# Patient Record
Sex: Male | Born: 1950 | Hispanic: No | Marital: Married | Smoking: Current some day smoker
Health system: Southern US, Community
[De-identification: ages and names within clinical notes are randomized; demographics above are authoritative.]

## PROBLEM LIST (undated history)

## (undated) DIAGNOSIS — I10 Essential (primary) hypertension: Secondary | ICD-10-CM

## (undated) DIAGNOSIS — R519 Headache, unspecified: Secondary | ICD-10-CM

## (undated) DIAGNOSIS — R51 Headache: Secondary | ICD-10-CM

## (undated) HISTORY — PX: OTHER SURGICAL HISTORY: SHX169

## (undated) HISTORY — PX: CHOLECYSTECTOMY: SHX55

---

## 2014-09-03 DIAGNOSIS — Z713 Dietary counseling and surveillance: Secondary | ICD-10-CM | POA: Diagnosis not present

## 2014-09-03 DIAGNOSIS — N189 Chronic kidney disease, unspecified: Secondary | ICD-10-CM | POA: Diagnosis not present

## 2014-09-03 DIAGNOSIS — Z7189 Other specified counseling: Secondary | ICD-10-CM | POA: Diagnosis not present

## 2014-09-03 DIAGNOSIS — S40029A Contusion of unspecified upper arm, initial encounter: Secondary | ICD-10-CM | POA: Diagnosis not present

## 2014-09-03 DIAGNOSIS — Z1211 Encounter for screening for malignant neoplasm of colon: Secondary | ICD-10-CM | POA: Diagnosis not present

## 2014-09-27 DIAGNOSIS — M545 Low back pain: Secondary | ICD-10-CM | POA: Diagnosis not present

## 2014-10-11 DIAGNOSIS — M545 Low back pain: Secondary | ICD-10-CM | POA: Diagnosis not present

## 2015-02-11 DIAGNOSIS — M545 Low back pain: Secondary | ICD-10-CM | POA: Diagnosis not present

## 2015-02-11 DIAGNOSIS — N528 Other male erectile dysfunction: Secondary | ICD-10-CM | POA: Diagnosis not present

## 2015-12-23 DIAGNOSIS — N5089 Other specified disorders of the male genital organs: Secondary | ICD-10-CM | POA: Diagnosis not present

## 2015-12-23 DIAGNOSIS — K219 Gastro-esophageal reflux disease without esophagitis: Secondary | ICD-10-CM | POA: Diagnosis not present

## 2015-12-23 DIAGNOSIS — J329 Chronic sinusitis, unspecified: Secondary | ICD-10-CM | POA: Diagnosis not present

## 2015-12-23 DIAGNOSIS — M545 Low back pain: Secondary | ICD-10-CM | POA: Diagnosis not present

## 2015-12-29 DIAGNOSIS — E119 Type 2 diabetes mellitus without complications: Secondary | ICD-10-CM | POA: Diagnosis not present

## 2015-12-29 DIAGNOSIS — F17200 Nicotine dependence, unspecified, uncomplicated: Secondary | ICD-10-CM | POA: Diagnosis not present

## 2015-12-29 DIAGNOSIS — R110 Nausea: Secondary | ICD-10-CM | POA: Diagnosis not present

## 2015-12-29 DIAGNOSIS — K859 Acute pancreatitis without necrosis or infection, unspecified: Secondary | ICD-10-CM | POA: Diagnosis not present

## 2015-12-29 DIAGNOSIS — R5381 Other malaise: Secondary | ICD-10-CM | POA: Diagnosis not present

## 2015-12-29 DIAGNOSIS — K567 Ileus, unspecified: Secondary | ICD-10-CM | POA: Diagnosis not present

## 2015-12-29 DIAGNOSIS — K812 Acute cholecystitis with chronic cholecystitis: Secondary | ICD-10-CM | POA: Diagnosis not present

## 2015-12-29 DIAGNOSIS — K810 Acute cholecystitis: Secondary | ICD-10-CM | POA: Diagnosis not present

## 2015-12-29 DIAGNOSIS — Y838 Other surgical procedures as the cause of abnormal reaction of the patient, or of later complication, without mention of misadventure at the time of the procedure: Secondary | ICD-10-CM | POA: Diagnosis not present

## 2015-12-29 DIAGNOSIS — R319 Hematuria, unspecified: Secondary | ICD-10-CM | POA: Diagnosis not present

## 2015-12-29 DIAGNOSIS — N179 Acute kidney failure, unspecified: Secondary | ICD-10-CM | POA: Diagnosis not present

## 2015-12-29 DIAGNOSIS — K8080 Other cholelithiasis without obstruction: Secondary | ICD-10-CM | POA: Diagnosis not present

## 2015-12-29 DIAGNOSIS — I10 Essential (primary) hypertension: Secondary | ICD-10-CM | POA: Diagnosis not present

## 2015-12-29 DIAGNOSIS — R1013 Epigastric pain: Secondary | ICD-10-CM | POA: Diagnosis not present

## 2015-12-29 DIAGNOSIS — Z87891 Personal history of nicotine dependence: Secondary | ICD-10-CM | POA: Diagnosis not present

## 2015-12-29 DIAGNOSIS — Z7401 Bed confinement status: Secondary | ICD-10-CM | POA: Diagnosis not present

## 2015-12-29 DIAGNOSIS — K828 Other specified diseases of gallbladder: Secondary | ICD-10-CM | POA: Diagnosis not present

## 2015-12-29 DIAGNOSIS — K219 Gastro-esophageal reflux disease without esophagitis: Secondary | ICD-10-CM | POA: Diagnosis present

## 2015-12-29 DIAGNOSIS — R279 Unspecified lack of coordination: Secondary | ICD-10-CM | POA: Diagnosis not present

## 2015-12-29 DIAGNOSIS — R109 Unspecified abdominal pain: Secondary | ICD-10-CM | POA: Diagnosis not present

## 2015-12-29 DIAGNOSIS — N183 Chronic kidney disease, stage 3 (moderate): Secondary | ICD-10-CM | POA: Diagnosis present

## 2015-12-29 DIAGNOSIS — K8000 Calculus of gallbladder with acute cholecystitis without obstruction: Secondary | ICD-10-CM | POA: Diagnosis not present

## 2015-12-29 DIAGNOSIS — I129 Hypertensive chronic kidney disease with stage 1 through stage 4 chronic kidney disease, or unspecified chronic kidney disease: Secondary | ICD-10-CM | POA: Diagnosis present

## 2015-12-29 DIAGNOSIS — K769 Liver disease, unspecified: Secondary | ICD-10-CM | POA: Diagnosis not present

## 2015-12-29 DIAGNOSIS — N390 Urinary tract infection, site not specified: Secondary | ICD-10-CM | POA: Diagnosis not present

## 2015-12-29 DIAGNOSIS — K838 Other specified diseases of biliary tract: Secondary | ICD-10-CM | POA: Diagnosis not present

## 2015-12-29 DIAGNOSIS — K839 Disease of biliary tract, unspecified: Secondary | ICD-10-CM | POA: Diagnosis not present

## 2015-12-29 DIAGNOSIS — Z79899 Other long term (current) drug therapy: Secondary | ICD-10-CM | POA: Diagnosis not present

## 2015-12-29 DIAGNOSIS — G894 Chronic pain syndrome: Secondary | ICD-10-CM | POA: Diagnosis not present

## 2015-12-29 DIAGNOSIS — Z9049 Acquired absence of other specified parts of digestive tract: Secondary | ICD-10-CM | POA: Diagnosis not present

## 2015-12-29 DIAGNOSIS — K9189 Other postprocedural complications and disorders of digestive system: Secondary | ICD-10-CM | POA: Diagnosis not present

## 2015-12-29 DIAGNOSIS — K8020 Calculus of gallbladder without cholecystitis without obstruction: Secondary | ICD-10-CM | POA: Diagnosis not present

## 2015-12-30 DIAGNOSIS — K8020 Calculus of gallbladder without cholecystitis without obstruction: Secondary | ICD-10-CM | POA: Diagnosis not present

## 2016-01-01 HISTORY — PX: CHOLECYSTECTOMY, LAPAROSCOPIC: SHX56

## 2016-01-03 ENCOUNTER — Ambulatory Visit (HOSPITAL_COMMUNITY): Payer: Medicare Other

## 2016-01-03 ENCOUNTER — Ambulatory Visit (HOSPITAL_COMMUNITY)
Admission: RE | Admit: 2016-01-03 | Discharge: 2016-01-03 | Disposition: A | Discharge status: Home / Self Care | Payer: Medicare Other | Source: Ambulatory Visit | Attending: Internal Medicine | Admitting: Internal Medicine

## 2016-01-03 ENCOUNTER — Encounter (HOSPITAL_COMMUNITY)
Admission: RE | Disposition: A | Discharge status: Home / Self Care | Payer: Self-pay | Source: Ambulatory Visit | Attending: Internal Medicine

## 2016-01-03 ENCOUNTER — Encounter (HOSPITAL_COMMUNITY): Payer: Self-pay | Admitting: *Deleted

## 2016-01-03 ENCOUNTER — Ambulatory Visit (HOSPITAL_COMMUNITY): Payer: Medicare Other | Admitting: Anesthesiology

## 2016-01-03 DIAGNOSIS — E119 Type 2 diabetes mellitus without complications: Secondary | ICD-10-CM | POA: Diagnosis not present

## 2016-01-03 DIAGNOSIS — Z87891 Personal history of nicotine dependence: Secondary | ICD-10-CM | POA: Diagnosis not present

## 2016-01-03 DIAGNOSIS — K219 Gastro-esophageal reflux disease without esophagitis: Secondary | ICD-10-CM | POA: Insufficient documentation

## 2016-01-03 DIAGNOSIS — K839 Disease of biliary tract, unspecified: Secondary | ICD-10-CM | POA: Diagnosis not present

## 2016-01-03 DIAGNOSIS — Z9049 Acquired absence of other specified parts of digestive tract: Secondary | ICD-10-CM | POA: Diagnosis not present

## 2016-01-03 DIAGNOSIS — K838 Other specified diseases of biliary tract: Secondary | ICD-10-CM | POA: Diagnosis not present

## 2016-01-03 DIAGNOSIS — K9189 Other postprocedural complications and disorders of digestive system: Secondary | ICD-10-CM | POA: Diagnosis not present

## 2016-01-03 DIAGNOSIS — I10 Essential (primary) hypertension: Secondary | ICD-10-CM | POA: Diagnosis not present

## 2016-01-03 HISTORY — DX: Headache, unspecified: R51.9

## 2016-01-03 HISTORY — DX: Essential (primary) hypertension: I10

## 2016-01-03 HISTORY — PX: ERCP: SHX5425

## 2016-01-03 HISTORY — DX: Headache: R51

## 2016-01-03 SURGERY — ERCP, WITH INTERVENTION IF INDICATED
Anesthesia: General

## 2016-01-03 MED ORDER — EPHEDRINE SULFATE 50 MG/ML IJ SOLN
INTRAMUSCULAR | Status: DC | PRN
  Administered 2016-01-03: 10 mg via INTRAVENOUS

## 2016-01-03 MED ORDER — GLYCOPYRROLATE 0.2 MG/ML IJ SOLN
0.2000 mg | Freq: Once | INTRAMUSCULAR | Status: AC
  Administered 2016-01-03: 0.2 mg via INTRAVENOUS

## 2016-01-03 MED ORDER — GLYCOPYRROLATE 0.2 MG/ML IJ SOLN
INTRAMUSCULAR | Status: AC
  Filled 2016-01-03: qty 1

## 2016-01-03 MED ORDER — STERILE WATER FOR IRRIGATION IR SOLN
Status: DC | PRN
  Administered 2016-01-03: 1000 mL

## 2016-01-03 MED ORDER — CEFAZOLIN SODIUM-DEXTROSE 2-4 GM/100ML-% IV SOLN
INTRAVENOUS | Status: AC
  Filled 2016-01-03: qty 100

## 2016-01-03 MED ORDER — LABETALOL HCL 5 MG/ML IV SOLN
20.0000 mg | Freq: Once | INTRAVENOUS | Status: AC
  Administered 2016-01-03: 20 mg via INTRAVENOUS

## 2016-01-03 MED ORDER — SODIUM CHLORIDE 0.9 % IV SOLN
INTRAVENOUS | Status: AC
  Filled 2016-01-03: qty 100

## 2016-01-03 MED ORDER — GLYCOPYRROLATE 0.2 MG/ML IJ SOLN
INTRAMUSCULAR | Status: DC | PRN
  Administered 2016-01-03: 0.4 mg via INTRAVENOUS

## 2016-01-03 MED ORDER — MIDAZOLAM HCL 2 MG/2ML IJ SOLN
1.0000 mg | INTRAMUSCULAR | Status: DC | PRN
  Administered 2016-01-03: 2 mg via INTRAVENOUS

## 2016-01-03 MED ORDER — NEOSTIGMINE METHYLSULFATE 10 MG/10ML IV SOLN
INTRAVENOUS | Status: DC | PRN
  Administered 2016-01-03: 3 mg via INTRAVENOUS

## 2016-01-03 MED ORDER — LIDOCAINE HCL 1 % IJ SOLN
INTRAMUSCULAR | Status: DC | PRN
  Administered 2016-01-03: 30 mg via INTRADERMAL

## 2016-01-03 MED ORDER — MIDAZOLAM HCL 5 MG/5ML IJ SOLN
INTRAMUSCULAR | Status: DC | PRN
  Administered 2016-01-03: 2 mg via INTRAVENOUS

## 2016-01-03 MED ORDER — ONDANSETRON HCL 4 MG/2ML IJ SOLN: 4.0000 mg | Freq: Once | INTRAMUSCULAR | Status: DC | PRN

## 2016-01-03 MED ORDER — ROCURONIUM BROMIDE 100 MG/10ML IV SOLN
INTRAVENOUS | Status: DC | PRN
  Administered 2016-01-03: 5 mg via INTRAVENOUS
  Administered 2016-01-03: 15 mg via INTRAVENOUS

## 2016-01-03 MED ORDER — GLUCAGON HCL RDNA (DIAGNOSTIC) 1 MG IJ SOLR
INTRAMUSCULAR | Status: AC
  Administered 2016-01-03: 0.25 mg via INTRAVENOUS
  Filled 2016-01-03: qty 2

## 2016-01-03 MED ORDER — GLYCOPYRROLATE 0.2 MG/ML IJ SOLN
INTRAMUSCULAR | Status: AC
  Filled 2016-01-03: qty 2

## 2016-01-03 MED ORDER — LACTATED RINGERS IV SOLN
INTRAVENOUS | Status: DC
  Administered 2016-01-03 (×2): via INTRAVENOUS

## 2016-01-03 MED ORDER — NEOSTIGMINE METHYLSULFATE 10 MG/10ML IV SOLN
INTRAVENOUS | Status: AC
  Filled 2016-01-03: qty 1

## 2016-01-03 MED ORDER — MIDAZOLAM HCL 2 MG/2ML IJ SOLN
INTRAMUSCULAR | Status: AC
  Filled 2016-01-03: qty 2

## 2016-01-03 MED ORDER — PROPOFOL 10 MG/ML IV BOLUS
INTRAVENOUS | Status: AC
  Filled 2016-01-03: qty 20

## 2016-01-03 MED ORDER — ONDANSETRON HCL 4 MG/2ML IJ SOLN
4.0000 mg | Freq: Once | INTRAMUSCULAR | Status: AC
  Administered 2016-01-03: 4 mg via INTRAVENOUS

## 2016-01-03 MED ORDER — ONDANSETRON HCL 4 MG/2ML IJ SOLN
INTRAMUSCULAR | Status: AC
  Filled 2016-01-03: qty 2

## 2016-01-03 MED ORDER — SUCCINYLCHOLINE CHLORIDE 20 MG/ML IJ SOLN
INTRAMUSCULAR | Status: DC | PRN
  Administered 2016-01-03: 150 mg via INTRAVENOUS

## 2016-01-03 MED ORDER — SODIUM CHLORIDE 0.9 % IV SOLN
INTRAVENOUS | Status: DC | PRN
  Administered 2016-01-03: 100 mL

## 2016-01-03 MED ORDER — FENTANYL CITRATE (PF) 100 MCG/2ML IJ SOLN
INTRAMUSCULAR | Status: AC
  Filled 2016-01-03: qty 2

## 2016-01-03 MED ORDER — LABETALOL HCL 5 MG/ML IV SOLN
INTRAVENOUS | Status: AC
  Filled 2016-01-03: qty 4

## 2016-01-03 MED ORDER — FENTANYL CITRATE (PF) 100 MCG/2ML IJ SOLN: 25.0000 ug | INTRAMUSCULAR | Status: DC | PRN

## 2016-01-03 MED ORDER — CEFAZOLIN SODIUM-DEXTROSE 2-4 GM/100ML-% IV SOLN: 2.0000 g | Freq: Once | INTRAVENOUS | Status: DC

## 2016-01-03 MED ORDER — PROPOFOL 10 MG/ML IV BOLUS
INTRAVENOUS | Status: DC | PRN
  Administered 2016-01-03: 150 mg via INTRAVENOUS

## 2016-01-03 MED ORDER — FENTANYL CITRATE (PF) 100 MCG/2ML IJ SOLN
INTRAMUSCULAR | Status: DC | PRN
  Administered 2016-01-03: 25 ug via INTRAVENOUS

## 2016-01-03 NOTE — Op Note (Signed)
[facility] Patient Name: Nathan Gonzalez Procedure Date: 01/03/2016 9:47 AM MRN: [id number] Date of Birth: March 28, 1951 Attending MD: Lionel December , MD CSN: [id number] Age: 65 Admit Type: Outpatient Procedure:                ERCP Indications:              Bile leak Providers:                Lionel December, MD, Brain Hilts, RN, Burke Keels, Technician Referring MD:             Fraser Din, MD Medicines:                General Anesthesia Complications:            No immediate complications. Estimated Blood Loss:     Estimated blood loss: none. Procedure:                Pre-Anesthesia Assessment:                           - Prior to the procedure, a History and Physical                            was performed, and patient medications and                            allergies were reviewed. The patient's tolerance of                            previous anesthesia was also reviewed. The risks                            and benefits of the procedure and the sedation                            options and risks were discussed with the patient.                            All questions were answered, and informed consent                            was obtained. Prior Anticoagulants: The patient                            last took heparin 1 day prior to the procedure. ASA                            Grade Assessment: II - A patient with mild systemic                            disease. After reviewing the risks and benefits,  the patient was deemed in satisfactory condition to                            undergo the procedure.                           After obtaining informed consent, the scope was                            passed under direct vision. Throughout the                            procedure, the patient's blood pressure, pulse, and                            oxygen saturations were monitored continuously. The                 [id number] ([id number] scope was introduced through                            the mouth, and used to inject contrast into and                            used to inject contrast into the bile duct. The                            ERCP was accomplished without difficulty. The                            patient tolerated the procedure well. Scope In: 11:20:34 AM Scope Out: 11:39:10 AM Total Procedure Duration: 0 hours 18 minutes 36 seconds  Findings:      The major papilla was normal. The bile duct was deeply cannulated with       the Autotome sphincterotome. Contrast was injected. I personally       interpreted the bile duct images. There was brisk flow of contrast       through the ducts. Image quality was excellent. Contrast extended to the       main bile duct. Extravasation of contrast originating from the cystic       duct remnant was observed. The main bile duct was mildly dilated. The       largest diameter was 9 mm. One 10 Fr by 7 cm plastic stent with two       internal flaps was placed 6 cm into the common bile duct. Bile flowed       through the stent. The stent was in good position. Impression:               - The major papilla appeared normal.                           - The entire main bile duct was mildly dilated.                           - A bile leak was found.                           -  One plastic stent was placed into the common bile                            duct. Moderate Sedation:      Per Anesthesia Care Recommendation:           - Return patient to referring hospital for ongoing                            care to Dr. Gabriel Rung service at [facility]                           - Clear liquid diet today.                           - Resume heparin at prior dose today.                           - Will plan stent removal in 8 weeks. Procedure Code(s):        --- Professional ---                           [phone], Endoscopic retrograde                             cholangiopancreatography (ERCP); with placement of                            endoscopic stent into biliary or pancreatic duct,                            including pre- and post-dilation and guide wire                            passage, when performed, including sphincterotomy,                            when performed, each stent Diagnosis Code(s):        --- Professional ---                           K83.8, Other specified diseases of biliary tract                           K83.9, Disease of biliary tract, unspecified CPT copyright 2016 American Medical Association. All rights reserved. The codes documented in this report are preliminary and upon coder review may  be revised to meet current compliance requirements. Lionel December, MD Lionel December, MD 01/03/2016 12:03:01 PM This report has been signed electronically. Number of Addenda: 0

## 2016-01-03 NOTE — Discharge Instructions (Signed)
Resume usual medications including heparin as before. Begin clear liquids if all right with Dr. Marcha Soldersathey. Will plan stent removal in 6-8 weeks. My office will contact patient. Call if you have any questions. My pager number is [phone]

## 2016-01-03 NOTE — Anesthesia Procedure Notes (Signed)
Procedure Name: Intubation Date/Time: 01/03/2016 10:54 AM Performed by: Despina HiddenIDACAVAGE, ROBERT J Pre-anesthesia Checklist: Emergency Drugs available, Patient identified, Suction available and Patient being monitored Patient Re-evaluated:Patient Re-evaluated prior to inductionOxygen Delivery Method: Circle system utilized Preoxygenation: Pre-oxygenation with 100% oxygen Intubation Type: IV induction, Rapid sequence and Cricoid Pressure applied Ventilation: Mask ventilation without difficulty Laryngoscope Size: Mac and 3 Grade View: Grade II Tube type: Oral Tube size: 8.0 mm Number of attempts: 1 Airway Equipment and Method: Stylet Placement Confirmation: ETT inserted through vocal cords under direct vision,  positive ETCO2 and breath sounds checked- equal and bilateral Secured at: 24 cm Tube secured with: Tape Dental Injury: Teeth and Oropharynx as per pre-operative assessment  Difficulty Due To: Difficulty was anticipated and Difficult Airway- due to dentition Comments: Right upper incisor loose prior to intubation.Intact post intubation.

## 2016-01-03 NOTE — Progress Notes (Signed)
[facility] in stable condition.

## 2016-01-03 NOTE — Transfer of Care (Signed)
Immediate Anesthesia Transfer of Care Note  Patient: Nathan BlueJohn Gonzalez  Procedure(s) Performed: Procedure(s): ENDOSCOPIC RETROGRADE CHOLANGIOPANCREATOGRAPHY (ERCP) WITH STENT PLACEMENT (N/A)  Patient Location: PACU  Anesthesia Type:General  Level of Consciousness: awake and patient cooperative  Airway & Oxygen Therapy: Patient Spontanous Breathing and Patient connected to face mask oxygen  Post-op Assessment: Report given to RN, Post -op Vital signs reviewed and stable and Patient moving all extremities  Post vital signs: Reviewed and stable  Last Vitals:  Filed Vitals:   01/03/16 1000 01/03/16 1027  BP: 153/95   Pulse:    Temp:  36.8 C  Resp: 28     Last Pain:  Filed Vitals:   01/03/16 1029  PainSc: 7       Patients Stated Pain Goal: 5 (01/03/16 0934)  Complications: No apparent anesthesia complications

## 2016-01-03 NOTE — Anesthesia Preprocedure Evaluation (Signed)
Anesthesia Evaluation  Patient identified by MRN, date of birth, ID band Patient awake    Reviewed: Allergy & Precautions, NPO status , Patient's Chart, lab work & pertinent test results  Airway Mallampati: II  TM Distance: >3 FB     Dental  (+) Poor Dentition, Loose, Dental Advisory Given, Missing,    Pulmonary Current Smoker,    breath sounds clear to auscultation       Cardiovascular hypertension, Pt. on medications  Rhythm:Regular Rate:Normal     Neuro/Psych    GI/Hepatic GERD  Medicated,  Endo/Other    Renal/GU Renal disease     Musculoskeletal   Abdominal   Peds  Hematology   Anesthesia Other Findings   Reproductive/Obstetrics                             Anesthesia Physical Anesthesia Plan  ASA: II and emergent  Anesthesia Plan: General   Post-op Pain Management:    Induction: Intravenous, Rapid sequence and Cricoid pressure planned  Airway Management Planned: Oral ETT  Additional Equipment:   Intra-op Plan:   Post-operative Plan: Extubation in OR  Informed Consent: I have reviewed the patients History and Physical, chart, labs and discussed the procedure including the risks, benefits and alternatives for the proposed anesthesia with the patient or authorized representative who has indicated his/her understanding and acceptance.   Dental advisory given  Plan Discussed with:   Anesthesia Plan Comments:         Anesthesia Quick Evaluation

## 2016-01-03 NOTE — H&P (Signed)
Nathan Gonzalez is an 65 y.o. male.   Chief Complaint: Patient is here for ERCP with biliary stenting for postcholecystectomy biliary. HPI: Patient is 65 year old American male who underwent laparoscopic cholecystectomy by Dr. Marcha Soldersathey on file 09/20/2015 for gangrenous gallbladder. He was able to do surgery laparoscopically. He had bilious lid via JP drain. He was therefore suspected to have biliary leak which was confirmed with HIDA scan 2 days ago. Patient therefore has been sent for biliary stenting to treat his leak. Patient states he feels better. He is hoping his NG tube will will be removed. He complains of irritation in his throat. His wife states he is coughing up thick mucus. He complains of right upper quadrant pain. He denies chest pain or shortness of breath. Last dose of heparin was yesterday evening.  Past Medical History  Diagnosis Date  . Hypertension     was on clonnidine but now not taking  . Diabetes mellitus without complication (HCC)   . Headache     Past Surgical History  Procedure Laterality Date  . Cholecystectomy, laparoscopic Bilateral 01/01/2016    [facility]    History reviewed. No pertinent family history. Social History:  reports that he quit smoking about 4 weeks ago. His smoking use included Cigarettes. He smoked 0.50 packs per day. He does not have any smokeless tobacco history on file. He reports that he drinks alcohol. He reports that he does not use illicit drugs.  Allergies: Not on File  No prescriptions prior to admission    No results found for this or any previous visit (from the past 48 hour(s)). No results found.  ROS  Blood pressure 153/95, pulse 88, temperature 98.3 F (36.8 C), temperature source Oral, resp. rate 28, height 5' 11.5" (1.816 m), weight 185 lb (83.915 kg), SpO2 98 %. Physical Exam  Constitutional: He appears well-developed and well-nourished.  Patient appears acutely ill and he has NG tube in place.  HENT:   Mouth/Throat: Oropharynx is clear and moist.  Eyes: Conjunctivae are normal. No scleral icterus.  Neck: No thyromegaly present.  Cardiovascular: Normal rate, regular rhythm and normal heart sounds.   No murmur heard. Respiratory: Effort normal and breath sounds normal.  GI:  Abdomen is distended with normal bowel sounds. He has JP drain in place. Abdomen is soft with moderate tenderness in right upper quadrant. No organomegaly or masses.  Musculoskeletal: He exhibits no edema.  Lymphadenopathy:    He has no cervical adenopathy.  Neurological: He is alert.  Skin: Skin is warm and dry.    Lab data from this morning WBC 15.0, H&H 12 and 36.3 and platelet count 454K. Serum sodium 146 potassium 4.1, chloride 109, CO2 22.7 BUN 14 creatinine 1.11 glucose 96. Bilirubin 1.1, AP 159, AST 31.7, ALT 32, total protein 6.2 and albumin 2.2. Serum calcium is 8.3.  HIDA scan results noted.  Assessment/Plan Post cholecystectomy biliary leak with chemical peritonitis. ERCP with biliary stenting with or without sphincterotomy. Procedure risks reviewed with the patient and his wife and they're both agreeable. Give patient 2 g of IV Ancef preoperatively. [facility].  Malissa HippoEHMAN,NAJEEB U, MD 01/03/2016, 10:33 AM

## 2016-01-06 NOTE — Anesthesia Postprocedure Evaluation (Signed)
Anesthesia Post Note  Patient: Nathan BlueJohn Gonzalez  Procedure(s) Performed: Procedure(s) (LRB): ENDOSCOPIC RETROGRADE CHOLANGIOPANCREATOGRAPHY (ERCP) WITH STENT PLACEMENT (N/A)  Patient location during evaluation: PACU Anesthesia Type: General Level of consciousness: awake and alert, oriented and patient cooperative Pain management: pain level controlled Vital Signs Assessment: post-procedure vital signs reviewed and stable Respiratory status: spontaneous breathing Cardiovascular status: blood pressure returned to baseline and stable Postop Assessment: no signs of nausea or vomiting Anesthetic complications: no    Last Vitals:  Filed Vitals:   01/03/16 1230 01/03/16 1245  BP: 164/87 162/84  Pulse: 88 89  Temp:    Resp: 29 24    Last Pain:  Filed Vitals:   01/03/16 1247  PainSc: 2                  IDACAVAGE,ROBERT J

## 2016-01-07 ENCOUNTER — Encounter (HOSPITAL_COMMUNITY): Payer: Self-pay | Admitting: Internal Medicine

## 2016-01-23 DIAGNOSIS — I10 Essential (primary) hypertension: Secondary | ICD-10-CM | POA: Diagnosis not present

## 2016-01-23 DIAGNOSIS — K210 Gastro-esophageal reflux disease with esophagitis: Secondary | ICD-10-CM | POA: Diagnosis not present

## 2016-01-23 DIAGNOSIS — K5909 Other constipation: Secondary | ICD-10-CM | POA: Diagnosis not present

## 2016-02-18 ENCOUNTER — Other Ambulatory Visit (INDEPENDENT_AMBULATORY_CARE_PROVIDER_SITE_OTHER): Payer: Self-pay | Admitting: Internal Medicine

## 2016-02-18 ENCOUNTER — Encounter (INDEPENDENT_AMBULATORY_CARE_PROVIDER_SITE_OTHER): Payer: Self-pay | Admitting: *Deleted

## 2016-02-18 DIAGNOSIS — K839 Disease of biliary tract, unspecified: Secondary | ICD-10-CM

## 2016-03-05 NOTE — Patient Instructions (Signed)
Nathan Gonzalez  03/05/2016     @PREFPERIOPPHARMACY @   Your procedure is scheduled on 03/13/2016.  [name].  Call this number if you have problems the morning of surgery:  [phone]   Remember:  Do not eat food or drink liquids after midnight.  Take these medicines the morning of surgery with A SIP OF WATER : NONE   Do not wear jewelry, make-up or nail polish.  Do not wear lotions, powders, or perfumes.  You may wear deoderant.  Do not shave 48 hours prior to surgery.  Men may shave face and neck.  Do not bring valuables to the hospital.  [facility] any belongings or valuables.  Contacts, dentures or bridgework may not be worn into surgery.  Leave your suitcase in the car.  After surgery it may be brought to your room.  For patients admitted to the hospital, discharge time will be determined by your treatment team.  Patients discharged the day of surgery will not be allowed to drive home.   Name and phone number of your driver:   FAMILY Special instructions:  N/A  Please read over the following fact sheets that you were given. Care and Recovery After Surgery   Endoscopic Retrograde Cholangiopancreatography (ERCP) Endoscopic retrograde cholangiopancreatography (ERCP) is a procedure used to diagnosis many diseases of the pancreas, bile ducts, liver, and gallbladder. During ERCP a thin, lighted tube (endoscope) is passed through the mouth and down the back of the throat into the first part of the small intestine (duodenum). A small, plastic tube (cannula) is then passed through the endoscope and directed into the bile duct or pancreatic duct. Dye is then injected through the cannula and X-rays are taken to study the biliary and pancreatic passageways.  LET [facility] KNOW ABOUT:   Any allergies you have.   All medicines you are taking, including vitamins, herbs, eyedrops, creams, and over-the-counter medicines.    Previous problems you or members of your family have had with the use of anesthetics.   Any blood disorders you have.   Previous surgeries you have had.   Medical conditions you have. RISKS AND COMPLICATIONS Generally, ERCP is a safe procedure. However, as with any procedure, complications can occur. A simple removal of gallstones has the lowest rate of complications. Higher rates of complication occur in people who have poorly functioning bile or pancreatic ducts. Possible complications include:   Pancreatitis.  Bleeding.  Accidental punctures in the bowel wall, pancreas, or gall bladder.  Gall bladder or bile duct infection. BEFORE THE PROCEDURE   Do not eat or drink anything, including water, for at least 8 hours before the procedure or as directed by your health care provider.   Ask your health care provider whether you should stop taking certain medicines prior to your procedure.   Arrange for someone to drive you home. You will not be allowed to drive for [phone] hours after the procedure. PROCEDURE   You will be given medicine through a vein (intravenously) to make you relaxed and sleepy.   You might have a breathing tube placed to give you medicine that makes you sleep (general anesthetic).   Your throat may be sprayed with medicine that numbs the area and prevents gagging (local anesthetic), or you may gargle this medicine.   You will lie on your left side.   The endoscope will be inserted through your mouth and into the duodenum. The  tube will not interfere with your breathing. Gagging is prevented by the anesthesia.   While X-rays are being taken, you may be positioned on your stomach.   A small sample of tissue (biopsy) may be removed for examination. AFTER THE PROCEDURE   You will rest in bed until you are fully conscious.   When you first wake up, your throat may feel slightly sore.   You will not be allowed to eat or drink until numbness  subsides.   Once you are able to drink, urinate, and sit on the edge of the bed without feeling sick to your stomach (nauseous) or dizzy, you may be allowed to go home.   This information is not intended to replace advice given to you by your health care provider. Make sure you discuss any questions you have with your health care provider.   Document Released: 05/12/2001 Document Revised: 06/07/2013 Document Reviewed: 03/28/2013 Elsevier Interactive Patient Education [employer]2016 Elsevier Inc.

## 2016-03-09 ENCOUNTER — Encounter (HOSPITAL_COMMUNITY)
Admission: RE | Admit: 2016-03-09 | Discharge: 2016-03-09 | Disposition: A | Discharge status: Home / Self Care | Payer: Medicare Other | Source: Ambulatory Visit | Attending: Internal Medicine | Admitting: Internal Medicine

## 2016-03-09 ENCOUNTER — Encounter (HOSPITAL_COMMUNITY): Payer: Self-pay

## 2016-03-09 VITALS — BP 136/82 | HR 69 | Temp 98.7°F | Resp 16 | Ht 71.0 in | Wt 176.0 lb

## 2016-03-09 DIAGNOSIS — K839 Disease of biliary tract, unspecified: Secondary | ICD-10-CM

## 2016-03-09 DIAGNOSIS — I10 Essential (primary) hypertension: Secondary | ICD-10-CM | POA: Insufficient documentation

## 2016-03-09 DIAGNOSIS — Z01812 Encounter for preprocedural laboratory examination: Secondary | ICD-10-CM | POA: Diagnosis not present

## 2016-03-09 LAB — BASIC METABOLIC PANEL
Anion gap: 6 (ref 5–15)
BUN: 13 mg/dL (ref 6–20)
CO2: 23 mmol/L (ref 22–32)
Calcium: 8.8 mg/dL — ABNORMAL LOW (ref 8.9–10.3)
Chloride: 108 mmol/L (ref 101–111)
Creatinine, Ser: 1.13 mg/dL (ref 0.61–1.24)
GFR calc Af Amer: >60 mL/min (ref >60)
GFR calc non Af Amer: >60 mL/min (ref >60)
Glucose, Bld: 101 mg/dL — ABNORMAL HIGH (ref 65–99)
Potassium: 3.5 mmol/L (ref 3.5–5.1)
Sodium: 137 mmol/L (ref 135–145)

## 2016-03-09 LAB — CBC
HCT: 36.4 % — ABNORMAL LOW (ref 39.0–52.0)
Hemoglobin: 12.1 g/dL — ABNORMAL LOW (ref 13.0–17.0)
MCH: 29.4 pg (ref 26.0–34.0)
MCHC: 33.2 g/dL (ref 30.0–36.0)
MCV: 88.6 fL (ref 78.0–100.0)
Platelets: 280 10*3/uL (ref 150–400)
RBC: 4.11 MIL/uL — ABNORMAL LOW (ref 4.22–5.81)
RDW: 14.1 % (ref 11.5–15.5)
WBC: 5.6 10*3/uL (ref 4.0–10.5)

## 2016-03-13 ENCOUNTER — Ambulatory Visit (HOSPITAL_COMMUNITY): Payer: Medicare Other | Admitting: Anesthesiology

## 2016-03-13 ENCOUNTER — Ambulatory Visit (HOSPITAL_COMMUNITY)
Admission: RE | Admit: 2016-03-13 | Discharge: 2016-03-13 | Disposition: A | Discharge status: Home / Self Care | Payer: Medicare Other | Source: Ambulatory Visit | Attending: Internal Medicine | Admitting: Internal Medicine

## 2016-03-13 ENCOUNTER — Ambulatory Visit (HOSPITAL_COMMUNITY): Payer: Medicare Other

## 2016-03-13 ENCOUNTER — Encounter (HOSPITAL_COMMUNITY)
Admission: RE | Disposition: A | Discharge status: Home / Self Care | Payer: Self-pay | Source: Ambulatory Visit | Attending: Internal Medicine

## 2016-03-13 ENCOUNTER — Encounter (HOSPITAL_COMMUNITY): Payer: Self-pay | Admitting: *Deleted

## 2016-03-13 DIAGNOSIS — K838 Other specified diseases of biliary tract: Secondary | ICD-10-CM | POA: Insufficient documentation

## 2016-03-13 DIAGNOSIS — K839 Disease of biliary tract, unspecified: Secondary | ICD-10-CM

## 2016-03-13 DIAGNOSIS — Z4659 Encounter for fitting and adjustment of other gastrointestinal appliance and device: Secondary | ICD-10-CM | POA: Insufficient documentation

## 2016-03-13 DIAGNOSIS — K219 Gastro-esophageal reflux disease without esophagitis: Secondary | ICD-10-CM | POA: Diagnosis not present

## 2016-03-13 DIAGNOSIS — Z87891 Personal history of nicotine dependence: Secondary | ICD-10-CM | POA: Diagnosis not present

## 2016-03-13 DIAGNOSIS — K8050 Calculus of bile duct without cholangitis or cholecystitis without obstruction: Secondary | ICD-10-CM | POA: Insufficient documentation

## 2016-03-13 DIAGNOSIS — K9189 Other postprocedural complications and disorders of digestive system: Secondary | ICD-10-CM | POA: Diagnosis not present

## 2016-03-13 DIAGNOSIS — I10 Essential (primary) hypertension: Secondary | ICD-10-CM | POA: Diagnosis not present

## 2016-03-13 HISTORY — PX: BALLOON DILATION: SHX5330

## 2016-03-13 HISTORY — PX: ERCP: SHX5425

## 2016-03-13 HISTORY — PX: GASTROINTESTINAL STENT REMOVAL: SHX6384

## 2016-03-13 HISTORY — PX: SPHINCTEROTOMY: SHX5544

## 2016-03-13 SURGERY — ERCP, WITH INTERVENTION IF INDICATED
Anesthesia: General

## 2016-03-13 MED ORDER — EPHEDRINE SULFATE 50 MG/ML IJ SOLN
INTRAMUSCULAR | Status: AC
  Filled 2016-03-13: qty 1

## 2016-03-13 MED ORDER — IOPAMIDOL (ISOVUE-300) INJECTION 61%
INTRAVENOUS | Status: DC | PRN
  Administered 2016-03-13: 50 mL via INTRAVENOUS

## 2016-03-13 MED ORDER — LIDOCAINE HCL (PF) 1 % IJ SOLN
INTRAMUSCULAR | Status: AC
  Filled 2016-03-13: qty 5

## 2016-03-13 MED ORDER — MIDAZOLAM HCL 2 MG/2ML IJ SOLN
INTRAMUSCULAR | Status: AC
  Filled 2016-03-13: qty 2

## 2016-03-13 MED ORDER — LIDOCAINE HCL 1 % IJ SOLN
INTRAMUSCULAR | Status: DC | PRN
  Administered 2016-03-13: 30 mg via INTRADERMAL

## 2016-03-13 MED ORDER — FENTANYL CITRATE (PF) 100 MCG/2ML IJ SOLN
INTRAMUSCULAR | Status: AC
  Filled 2016-03-13: qty 2

## 2016-03-13 MED ORDER — FENTANYL CITRATE (PF) 100 MCG/2ML IJ SOLN
25.0000 ug | INTRAMUSCULAR | Status: AC | PRN
  Administered 2016-03-13 (×2): 25 ug via INTRAVENOUS

## 2016-03-13 MED ORDER — PROPOFOL 10 MG/ML IV BOLUS
INTRAVENOUS | Status: DC | PRN
  Administered 2016-03-13: 160 mg via INTRAVENOUS

## 2016-03-13 MED ORDER — MIDAZOLAM HCL 2 MG/2ML IJ SOLN
1.0000 mg | INTRAMUSCULAR | Status: DC | PRN
  Administered 2016-03-13: 2 mg via INTRAVENOUS

## 2016-03-13 MED ORDER — FENTANYL CITRATE (PF) 100 MCG/2ML IJ SOLN: 25.0000 ug | INTRAMUSCULAR | Status: DC | PRN

## 2016-03-13 MED ORDER — ROCURONIUM BROMIDE 100 MG/10ML IV SOLN
INTRAVENOUS | Status: DC | PRN
  Administered 2016-03-13: 5 mg via INTRAVENOUS

## 2016-03-13 MED ORDER — ONDANSETRON HCL 4 MG/2ML IJ SOLN: 4.0000 mg | Freq: Once | INTRAMUSCULAR | Status: DC | PRN

## 2016-03-13 MED ORDER — STERILE WATER FOR IRRIGATION IR SOLN
Status: DC | PRN
  Administered 2016-03-13: 1000 mL

## 2016-03-13 MED ORDER — LACTATED RINGERS IV SOLN
INTRAVENOUS | Status: DC
  Administered 2016-03-13: 07:00:00 via INTRAVENOUS

## 2016-03-13 MED ORDER — PROPOFOL 10 MG/ML IV BOLUS
INTRAVENOUS | Status: AC
  Filled 2016-03-13: qty 20

## 2016-03-13 MED ORDER — ONDANSETRON HCL 4 MG/2ML IJ SOLN
4.0000 mg | Freq: Once | INTRAMUSCULAR | Status: AC
  Administered 2016-03-13: 4 mg via INTRAVENOUS

## 2016-03-13 MED ORDER — SUCCINYLCHOLINE CHLORIDE 20 MG/ML IJ SOLN
INTRAMUSCULAR | Status: DC | PRN
  Administered 2016-03-13: 100 mg via INTRAVENOUS

## 2016-03-13 MED ORDER — FENTANYL CITRATE (PF) 250 MCG/5ML IJ SOLN
INTRAMUSCULAR | Status: DC | PRN
  Administered 2016-03-13 (×2): 50 ug via INTRAVENOUS

## 2016-03-13 MED ORDER — GLUCAGON HCL RDNA (DIAGNOSTIC) 1 MG IJ SOLR
INTRAMUSCULAR | Status: DC | PRN
  Administered 2016-03-13 (×2): 0.25 mg via INTRAVENOUS

## 2016-03-13 MED ORDER — FENTANYL CITRATE (PF) 250 MCG/5ML IJ SOLN
INTRAMUSCULAR | Status: AC
  Filled 2016-03-13: qty 5

## 2016-03-13 MED ORDER — GLYCOPYRROLATE 0.2 MG/ML IJ SOLN
INTRAMUSCULAR | Status: AC
  Filled 2016-03-13: qty 1

## 2016-03-13 MED ORDER — ROCURONIUM BROMIDE 50 MG/5ML IV SOLN
INTRAVENOUS | Status: AC
  Filled 2016-03-13: qty 1

## 2016-03-13 MED ORDER — ONDANSETRON HCL 4 MG/2ML IJ SOLN
INTRAMUSCULAR | Status: AC
  Filled 2016-03-13: qty 2

## 2016-03-13 MED ORDER — SODIUM CHLORIDE 0.9 % IJ SOLN
INTRAMUSCULAR | Status: AC
  Filled 2016-03-13: qty 10

## 2016-03-13 MED ORDER — SUCCINYLCHOLINE CHLORIDE 20 MG/ML IJ SOLN
INTRAMUSCULAR | Status: AC
  Filled 2016-03-13: qty 1

## 2016-03-13 MED ORDER — GLYCOPYRROLATE 0.2 MG/ML IJ SOLN
0.2000 mg | Freq: Once | INTRAMUSCULAR | Status: AC | PRN
  Administered 2016-03-13: 0.2 mg via INTRAVENOUS

## 2016-03-13 NOTE — Anesthesia Preprocedure Evaluation (Addendum)
Anesthesia Evaluation  Patient identified by MRN, date of birth, ID band Patient awake    Reviewed: Allergy & Precautions, NPO status , Patient's Chart, lab work & pertinent test results  Airway Mallampati: II  TM Distance: >3 FB     Dental  (+) Poor Dentition, Loose, Dental Advisory Given, Missing,    Pulmonary Current Smoker, former smoker,    breath sounds clear to auscultation       Cardiovascular hypertension, Pt. on medications  Rhythm:Regular Rate:Normal     Neuro/Psych  Headaches,    GI/Hepatic GERD  Medicated,  Endo/Other  neg diabetes  Renal/GU Renal disease     Musculoskeletal   Abdominal   Peds  Hematology   Anesthesia Other Findings   Reproductive/Obstetrics                            Anesthesia Physical Anesthesia Plan  ASA: II and emergent  Anesthesia Plan: General   Post-op Pain Management:    Induction: Intravenous, Rapid sequence and Cricoid pressure planned  Airway Management Planned: Oral ETT  Additional Equipment:   Intra-op Plan:   Post-operative Plan: Extubation in OR  Informed Consent: I have reviewed the patients History and Physical, chart, labs and discussed the procedure including the risks, benefits and alternatives for the proposed anesthesia with the patient or authorized representative who has indicated his/her understanding and acceptance.   Dental advisory given  Plan Discussed with:   Anesthesia Plan Comments:         Anesthesia Quick Evaluation

## 2016-03-13 NOTE — Discharge Instructions (Signed)
No Aspirin or NSAIDs for three days. Resume usual medications. Clear liquids today. Usual diet starting tomorrow morning. No driving for 24 hours. LFTs in one month. Office will call.    Endoscopic Retrograde Cholangiopancreatography (ERCP), Care After Refer to this sheet in the next few weeks. These instructions provide you with information on caring for yourself after your procedure. Your health care provider may also give you more specific instructions. Your treatment has been planned according to current medical practices, but problems sometimes occur. Call your health care provider if you have any problems or questions after your procedure.  WHAT TO EXPECT AFTER THE PROCEDURE  After your procedure, it is typical to feel:   Soreness in your throat.   Sick to your stomach (nauseous).   Bloated.  Dizzy.   Fatigued. HOME CARE INSTRUCTIONS  Have a friend or family member stay with you for the first 24 hours after your procedure.  Start taking your usual medicines and eating normally as soon as you feel well enough to do so or as directed by your health care provider. SEEK MEDICAL CARE IF:  You have abdominal pain.   You develop signs of infection, such as:   Chills.   Feeling unwell.  SEEK IMMEDIATE MEDICAL CARE IF:  You have difficulty swallowing.  You have worsening throat, chest, or abdominal pain.  You vomit.  You have bloody or very black stools.  You have a fever.   This information is not intended to replace advice given to you by your health care provider. Make sure you discuss any questions you have with your health care provider.   Document Released: 06/07/2013 Document Reviewed: 06/07/2013 Elsevier Interactive Patient Education [employer]2016 Elsevier Inc.

## 2016-03-13 NOTE — H&P (Signed)
Nathan Gonzalez is an 65 y.o. male.   Chief Complaint: Patient is here for ERCP and stent removal. HPI: Patient is 65 year old African-American male who underwent ERCP for post cholecystectomy biliary leak from cystic duct remnant on 01/03/2016. He is not returning for stent removal. He feels much better. He is not having any abdominal pain but he has intermittent nausea for which she takes Pepto-Bismol. Nausea is not daily symptom. He has good appetite. He has not lost any weight. He denies melena or rectal bleeding.  Past Medical History  Diagnosis Date  . Hypertension     was on clonnidine but now not taking  . Headache     Past Surgical History  Procedure Laterality Date  . Cholecystectomy, laparoscopic Bilateral 01/01/2016    [facility]  . Ercp N/A 01/03/2016    Procedure: ENDOSCOPIC RETROGRADE CHOLANGIOPANCREATOGRAPHY (ERCP) WITH STENT PLACEMENT;  Surgeon: Malissa HippoNajeeb U Rehman, MD;  Location: [facility] SUITE;  Service: Endoscopy;  Laterality: N/A;  . Cholecystectomy    . Collarbone Left     Repair collarbone fracture    History reviewed. No pertinent family history. Social History:  reports that he quit smoking about 3 months ago. His smoking use included Cigarettes. He smoked 0.50 packs per day. He does not have any smokeless tobacco history on file. He reports that he drinks alcohol. He reports that he does not use illicit drugs.  Allergies: No Known Allergies  Medications Prior to Admission  Medication Sig Dispense Refill  . amLODipine (NORVASC) 5 MG tablet Take 5 mg by mouth 2 (two) times daily.    . cloNIDine (CATAPRES) 0.1 MG tablet Take one tablet daily.  0  . DOK PLUS 50-8.6 MG tablet Take two tablets daily.  0  . HYDROcodone-acetaminophen (NORCO/VICODIN) 5-325 MG tablet Take 1 take one every 6 hours as needed for pain.  0  . lisinopril (PRINIVIL,ZESTRIL) 10 MG tablet Take two tablets by mouth once daily.  0    No results found for this or any previous visit  (from the past 48 hour(s)). No results found.  ROS  Blood pressure 161/96, pulse 73, temperature 98.1 F (36.7 C), temperature source Oral, resp. rate 22, height 5\' 11"  (1.803 m), weight 176 lb (79.833 kg), SpO2 99 %. Physical Exam  Constitutional: He appears well-developed and well-nourished.  HENT:  Mouth/Throat: Oropharynx is clear and moist.  Eyes: Conjunctivae are normal. No scleral icterus.  Neck: No thyromegaly present.  Cardiovascular: Normal rate, regular rhythm and normal heart sounds.   No murmur heard. Respiratory: Effort normal and breath sounds normal.  GI: Soft. He exhibits no distension and no mass. There is no tenderness.  Musculoskeletal: He exhibits no edema.  Lymphadenopathy:    He has no cervical adenopathy.  Neurological: He is alert.  Skin: Skin is warm and dry.     Assessment/Plan History of biliary leak following cholecystectomy treated with biliary stenting. ERCP with stent removal unless leak has not sealed.  Lionel DecemberNajeeb Rehman, MD 03/13/2016, 7:25 AM

## 2016-03-13 NOTE — Transfer of Care (Signed)
Immediate Anesthesia Transfer of Care Note  Patient: Nathan Gonzalez  Procedure(s) Performed: Procedure(s): ENDOSCOPIC RETROGRADE CHOLANGIOPANCREATOGRAPHY (ERCP) (N/A) BILIARY STENT REMOVAL (N/A) SPHINCTEROTOMY (N/A) BALLOON DILATION (N/A)  Patient Location: PACU  Anesthesia Type:General  Level of Consciousness: awake  Airway & Oxygen Therapy: Patient Spontanous Breathing and Patient connected to face mask oxygen  Post-op Assessment: Report given to RN  Post vital signs: Reviewed and stable  Last Vitals:  Filed Vitals:   03/13/16 0750 03/13/16 0800  BP: 144/96   Pulse:    Temp:    Resp: 16 20    Last Pain: There were no vitals filed for this visit.    Patients Stated Pain Goal: 5 (03/13/16 0716)  Complications: No apparent anesthesia complications

## 2016-03-13 NOTE — Anesthesia Postprocedure Evaluation (Signed)
Anesthesia Post Note  Patient: [name]  Procedure(s) Performed: Procedure(s) (LRB): ENDOSCOPIC RETROGRADE CHOLANGIOPANCREATOGRAPHY (ERCP) (N/A) BILIARY STENT REMOVAL (N/A) SPHINCTEROTOMY (N/A) BALLOON DILATION (N/A)  Patient location during evaluation: Short Stay Anesthesia Type: General Level of consciousness: awake and alert and oriented Pain management: pain level controlled Vital Signs Assessment: post-procedure vital signs reviewed and stable Respiratory status: spontaneous breathing Cardiovascular status: blood pressure returned to baseline Postop Assessment: no signs of nausea or vomiting Anesthetic complications: no    Last Vitals:  Filed Vitals:   03/13/16 0958 03/13/16 1010  BP: 151/85 144/95  Pulse: 80 74  Temp:  36.2 C  Resp: 15 18    Last Pain: There were no vitals filed for this visit.               DANIEL,KAREN

## 2016-03-13 NOTE — Anesthesia Procedure Notes (Signed)
Procedure Name: Intubation Date/Time: 03/13/2016 8:12 AM Performed by: Glynn OctaveANIEL, KAREN E Pre-anesthesia Checklist: Patient identified, Patient being monitored, Timeout performed, Emergency Drugs available and Suction available Patient Re-evaluated:Patient Re-evaluated prior to inductionOxygen Delivery Method: Circle system utilized Preoxygenation: Pre-oxygenation with 100% oxygen Intubation Type: IV induction, Rapid sequence and Cricoid Pressure applied Ventilation: Mask ventilation without difficulty Laryngoscope Size: Mac and 3 Grade View: Grade II Tube type: Oral Tube size: 7.0 mm Number of attempts: 1 Airway Equipment and Method: Stylet Placement Confirmation: ETT inserted through vocal cords under direct vision,  positive ETCO2 and breath sounds checked- equal and bilateral Secured at: 21 cm Tube secured with: Tape Dental Injury: Teeth and Oropharynx as per pre-operative assessment  Comments: Teeth protector applied, bite block after intubation.

## 2016-03-13 NOTE — Op Note (Signed)
[facility] Patient Name: Nathan Gonzalez Procedure Date: 03/13/2016 7:42 AM MRN: [id number] Date of Birth: 1950/10/22 Attending MD: Lionel December , MD CSN: [id number] Age: 65 Admit Type: Outpatient Procedure:                ERCP Indications:              Follow-up of bile leak, Biliary stent removal Providers:                Lionel December, MD, Tammy Vaught, RN, Lollie Marrow.                            Val Eagle, Technician Referring MD:             Fraser Din, MD Medicines:                General Anesthesia Complications:            No immediate complications. Estimated Blood Loss:     Estimated blood loss: none. Procedure:                Pre-Anesthesia Assessment:                           - Prior to the procedure, a History and Physical                            was performed, and patient medications and                            allergies were reviewed. The patient's tolerance of                            previous anesthesia was also reviewed. The risks                            and benefits of the procedure and the sedation                            options and risks were discussed with the patient.                            All questions were answered, and informed consent                            was obtained. Prior Anticoagulants: The patient has                            taken no previous anticoagulant or antiplatelet                            agents. ASA Grade Assessment: II - A patient with                            mild systemic disease. After reviewing the risks  and benefits, the patient was deemed in                            satisfactory condition to undergo the procedure.                           After obtaining informed consent, the scope was                            passed under direct vision. Throughout the                            procedure, the patient's blood pressure, pulse, and                            oxygen  saturations were monitored continuously. The                            ZO-[id number] ([id number]) scope was introduced through                            the and used to inject contrast into and used for                            direct visualization of the bile duct. The ERCP was                            accomplished without difficulty. The patient                            tolerated the procedure well. Scope In: 8:24:19 AM Scope Out: 8:56:18 AM Total Procedure Duration: 0 hours 31 minutes 59 seconds  Findings:      A biliary stent was visible on the scout film. One stent was removed       from the common bile duct using a snare. The stent was found to be       patent. The major papilla was normal. The bile duct was deeply       cannulated with the Autotome sphincterotome. Contrast was injected. I       personally interpreted the bile duct images. Ductal flow of contrast was       adequate. Image quality was excellent. Contrast extended to the main       bile duct. Contrast extended to the bifurcation. Contrast extended to       the hepatic ducts. The main bile duct was moderately dilated, acquired.       The largest diameter was 14 mm. The lower third of the main bile duct       contained two stones, the largest of which was 5 mm in diameter. Most       distalsegment of CBD was normal. CBD/CHD dilation was level of stone. A       7 mm biliary sphincterotomy was made with a braided Autotome       sphincterotome using ERBE electrocautery. There was no       post-sphincterotomy bleeding. The biliary tree was swept with a 10  mm       balloon starting at the upper third of the main bile duct, middle third       of the main bile duct and lower third of the main duct. Two stones were       removed. No stones remained. Impression:               - - One stent was removed from the common bile duct.                           - The major papilla appeared normal.                           - The  entire main bile duct was moderately dilated                            except most distal segment. Transition noted at the                            level of stones.                           - Choledocholithiasis was found. Complete removal                            was accomplished by biliary sphincterotomy and                            balloon extraction.                           - One stent was removed from the common bile duct.                           - Pancreatic duct was not cannulated or filled with                            contrast. Moderate Sedation:      Per Anesthesia Care Recommendation:           - Avoid aspirin and nonsteroidal anti-inflammatory                            medicines for 3 days.                           - Clear liquid diet today.                           - Resume previous diet starting tomorrow morning.                           - Check albumin, alkaline phosphatase, ALT (SGPT),                            AST (SGOT), bilirubin (total), bilirubin                            (  direct/conjugated) and bilirubin                            (indirect/unconjugated) in 4 weeks.                           - Return to GI office in 3 months. Procedure Code(s):        --- Professional ---                           [phone], Endoscopic retrograde                            cholangiopancreatography (ERCP); with removal of                            foreign body(s) or stent(s) from biliary/pancreatic                            duct(s)                           43264, Endoscopic retrograde                            cholangiopancreatography (ERCP); with removal of                            calculi/debris from biliary/pancreatic duct(s)                           43262, Endoscopic retrograde                            cholangiopancreatography (ERCP); with                            sphincterotomy/papillotomy Diagnosis Code(s):        --- Professional ---                            K83.8, Other specified diseases of biliary tract                           K80.50, Calculus of bile duct without cholangitis                            or cholecystitis without obstruction                           Z46.59, Encounter for fitting and adjustment of                            other gastrointestinal appliance and device CPT copyright 2016 American Medical Association. All rights reserved. The codes documented in this report are preliminary and upon coder review may  be revised to meet current compliance requirements. Lionel December, MD Lionel December, MD 03/13/2016 9:46:16 AM This report has been signed  electronically. Number of Addenda: 0

## 2016-03-17 ENCOUNTER — Encounter (INDEPENDENT_AMBULATORY_CARE_PROVIDER_SITE_OTHER): Payer: Self-pay | Admitting: *Deleted

## 2016-03-19 ENCOUNTER — Encounter (HOSPITAL_COMMUNITY): Payer: Self-pay | Admitting: Internal Medicine

## 2016-04-28 DIAGNOSIS — K210 Gastro-esophageal reflux disease with esophagitis: Secondary | ICD-10-CM | POA: Diagnosis not present

## 2016-04-28 DIAGNOSIS — I10 Essential (primary) hypertension: Secondary | ICD-10-CM | POA: Diagnosis not present

## 2016-04-28 DIAGNOSIS — Z125 Encounter for screening for malignant neoplasm of prostate: Secondary | ICD-10-CM | POA: Diagnosis not present

## 2016-04-28 DIAGNOSIS — Z0000 Encounter for general adult medical examination without abnormal findings: Secondary | ICD-10-CM | POA: Diagnosis not present

## 2016-04-28 DIAGNOSIS — Z131 Encounter for screening for diabetes mellitus: Secondary | ICD-10-CM | POA: Diagnosis not present

## 2016-04-30 DIAGNOSIS — Z1211 Encounter for screening for malignant neoplasm of colon: Secondary | ICD-10-CM | POA: Diagnosis not present

## 2016-06-16 ENCOUNTER — Encounter (INDEPENDENT_AMBULATORY_CARE_PROVIDER_SITE_OTHER): Payer: Self-pay | Admitting: Internal Medicine

## 2016-06-16 ENCOUNTER — Ambulatory Visit (INDEPENDENT_AMBULATORY_CARE_PROVIDER_SITE_OTHER): Payer: Medicare Other | Admitting: Internal Medicine

## 2016-07-31 DIAGNOSIS — I10 Essential (primary) hypertension: Secondary | ICD-10-CM | POA: Diagnosis not present

## 2016-07-31 DIAGNOSIS — K210 Gastro-esophageal reflux disease with esophagitis: Secondary | ICD-10-CM | POA: Diagnosis not present

## 2016-11-02 DIAGNOSIS — K210 Gastro-esophageal reflux disease with esophagitis: Secondary | ICD-10-CM | POA: Diagnosis not present

## 2016-11-02 DIAGNOSIS — I10 Essential (primary) hypertension: Secondary | ICD-10-CM | POA: Diagnosis not present

## 2016-11-02 DIAGNOSIS — E782 Mixed hyperlipidemia: Secondary | ICD-10-CM | POA: Diagnosis not present

## 2016-11-12 IMAGING — RF DG ERCP WO/W SPHINCTEROTOMY
1 series · 14 of 24 positions shown · non-contrast
Comparison: 01/03/2016

CLINICAL DATA: Postoperative bile leak

EXAM:
ERCP
TECHNIQUE: Multiple spot images obtained with the fluoroscopic device and
submitted for interpretation post-procedure.
FLUOROSCOPY TIME:  Radiation Exposure Index (as provided by the
fluoroscopic device): Not provided
If the device does not provide the exposure index:
Fluoroscopy Time:  4 minutes66 seconds
Number of Acquired Images:  Numerous images and runs

[Series 1: run · 21 acquisitions, 14 frames shown]
[im 1/21]
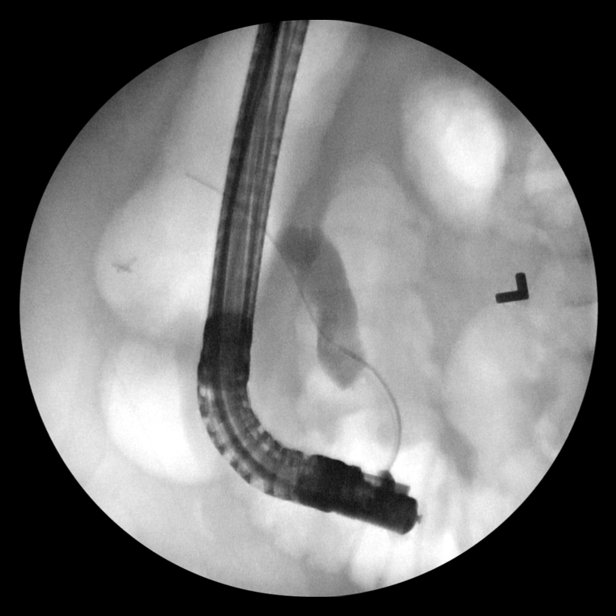
[im 1/21]
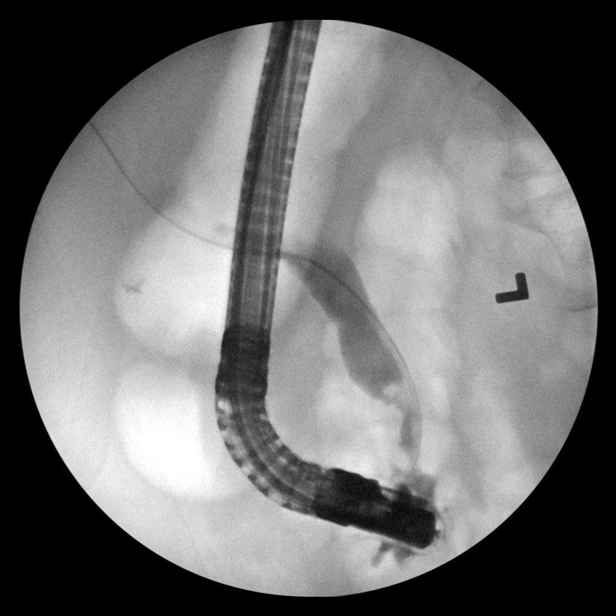
[im 3/21]
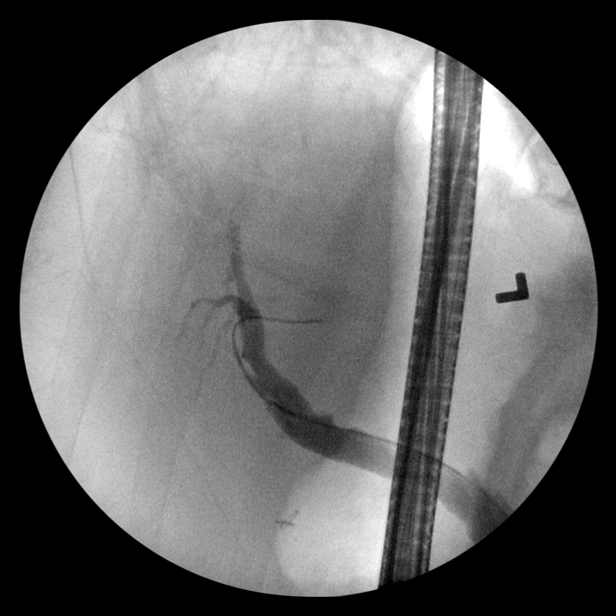
[im 6/21]
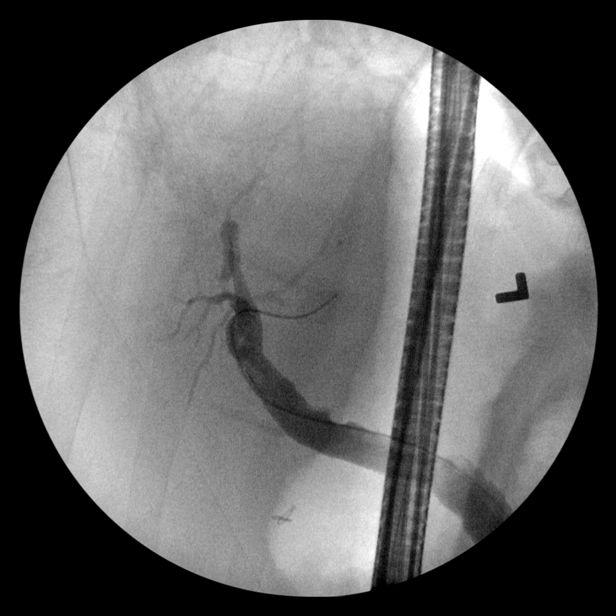
[im 7/21]
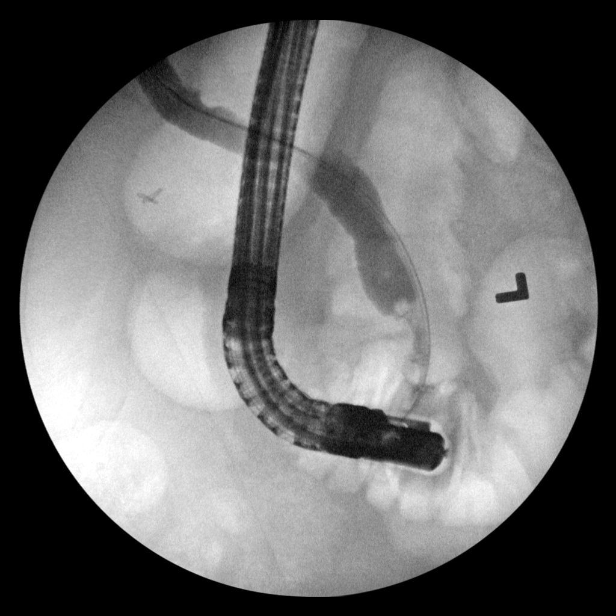
[im 9/21]
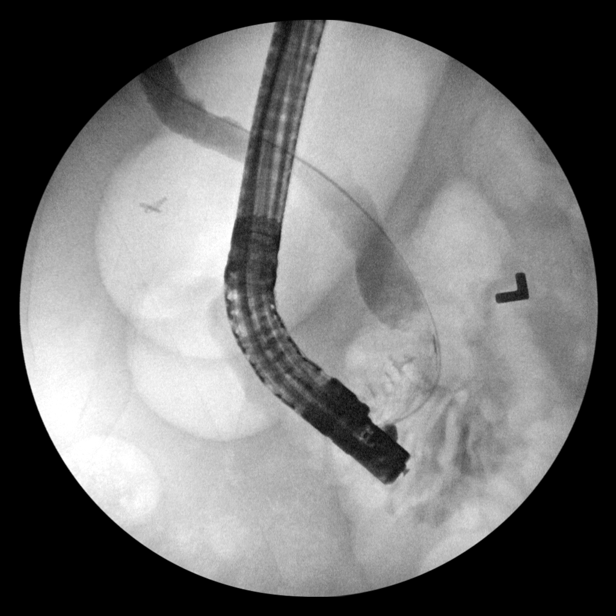
[im 12/21]
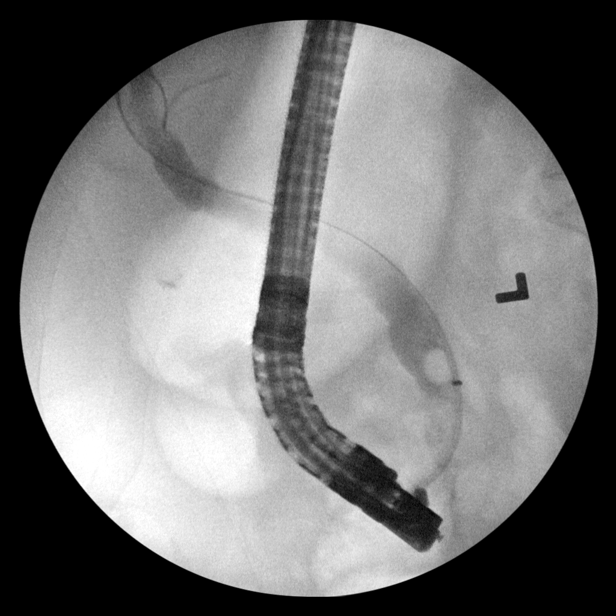
[im 13/21]
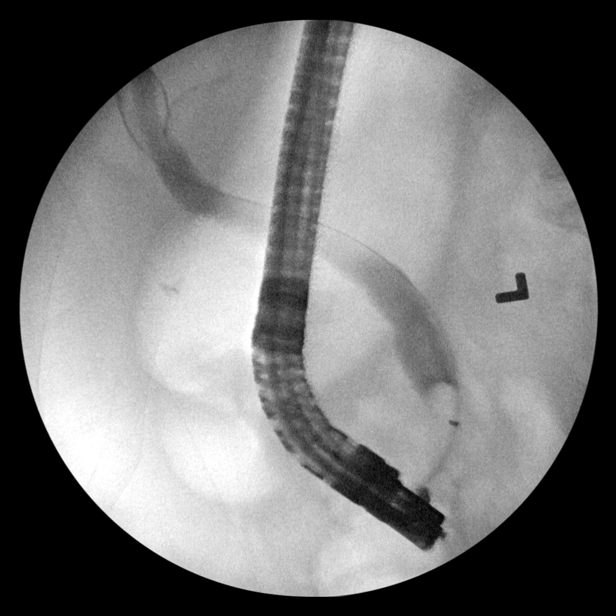
[im 16/21]
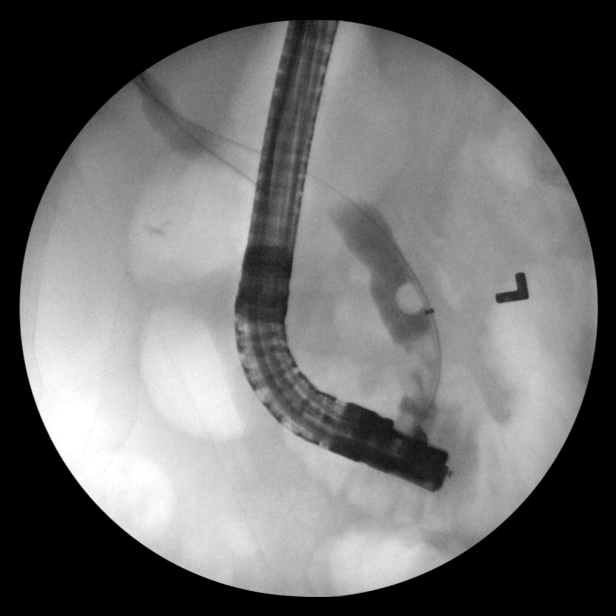
[im 16/21]
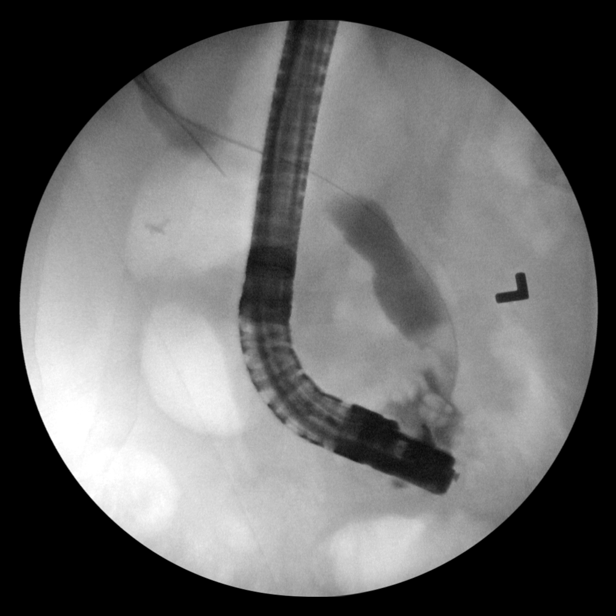
[im 18/21]
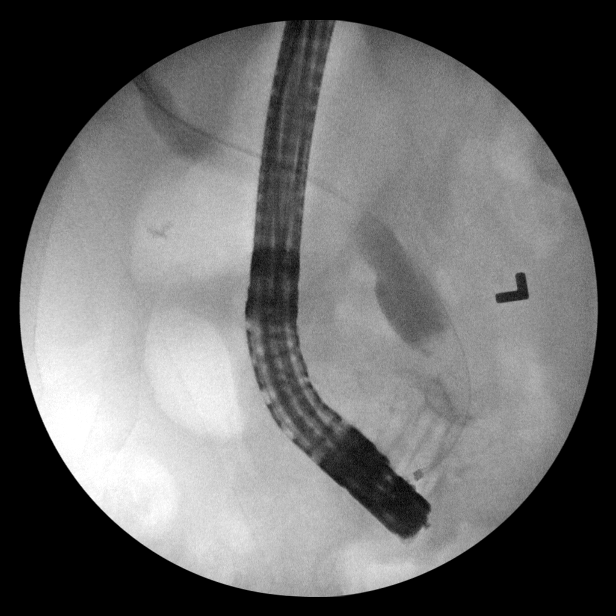
[im 19/21]
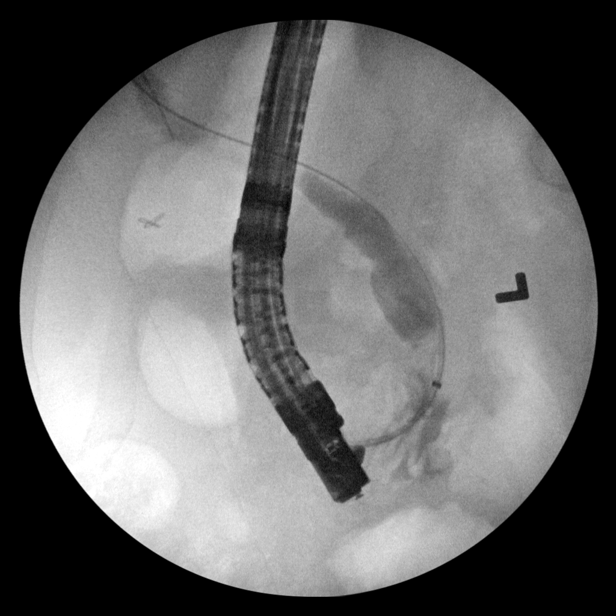
[im 21/21]
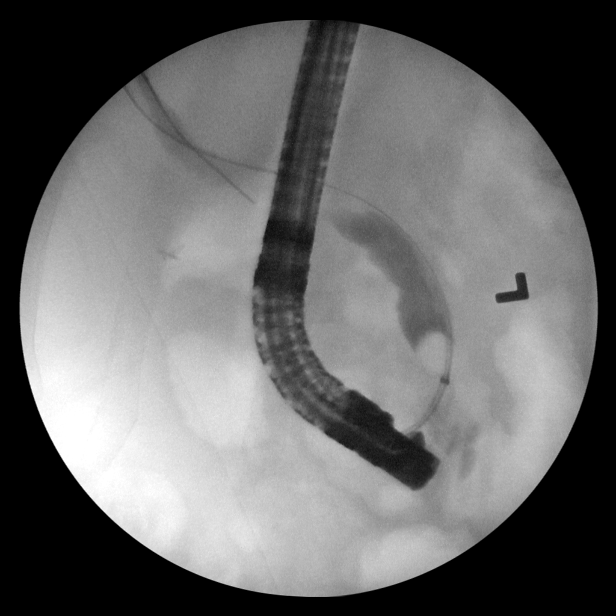
[im 21/21]
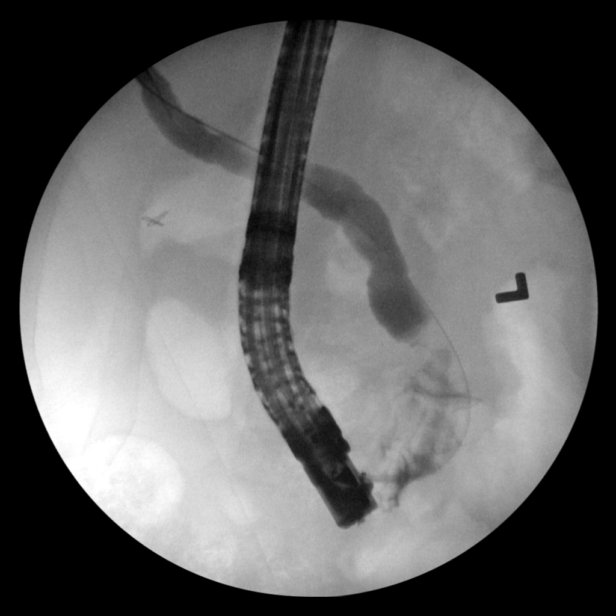

[14 of 24 positions shown; findings below may reference images not displayed]

FINDINGS: Dilated CBD estimated at 13-14 mm assuming 14 mm endoscope.

Filling defect in distal CBD consistent with choledocholithiasis.

Question of a second filling defect at distal CBD as well.

Two common duct stones were extracted.

Following stone extraction, a short segment of marked narrowing of
the distal CBD is identified, demonstrating mild shouldering.

With subsequent balloon passage, there is delayed passage of the
balloon beyond this segment with significant resistance; balloon
eventually popped across the narrowed segment with increased
traction.

This portion of the distal CBD never distends normally with contrast
during exam.
IMPRESSION: Choledocholithiasis, with 2 stones removed during the procedure.

Biliary dilatation with CBD estimated at 12-14 mm diameter.

Narrowed short segment of distal CBD new, requiring increased
traction to pass the CBD balloon.

This corresponds to the site of an impacted CBD stone at time of
prior ERCP and sphincterotomy.

This may represent focal edema at site of prior stone obstruction,
benign stricture, or malignant stricture and requires follow-up
assessment to differentiate ; this does not have the tapered
appearance suggestive of extrinsic compression.

Discussed with Dr. Iev during the final portions of the
procedure.

These images were submitted for radiologic interpretation only.
Please see the procedural report for the amount of contrast and the
fluoroscopy time utilized.

## 2017-02-01 DIAGNOSIS — I10 Essential (primary) hypertension: Secondary | ICD-10-CM | POA: Diagnosis not present

## 2017-02-01 DIAGNOSIS — E782 Mixed hyperlipidemia: Secondary | ICD-10-CM | POA: Diagnosis not present

## 2017-02-01 DIAGNOSIS — N182 Chronic kidney disease, stage 2 (mild): Secondary | ICD-10-CM | POA: Diagnosis not present

## 2017-02-01 DIAGNOSIS — K210 Gastro-esophageal reflux disease with esophagitis: Secondary | ICD-10-CM | POA: Diagnosis not present

## 2017-06-04 DIAGNOSIS — N182 Chronic kidney disease, stage 2 (mild): Secondary | ICD-10-CM | POA: Diagnosis not present

## 2017-06-04 DIAGNOSIS — I10 Essential (primary) hypertension: Secondary | ICD-10-CM | POA: Diagnosis not present

## 2017-06-04 DIAGNOSIS — E782 Mixed hyperlipidemia: Secondary | ICD-10-CM | POA: Diagnosis not present

## 2017-06-04 DIAGNOSIS — K210 Gastro-esophageal reflux disease with esophagitis: Secondary | ICD-10-CM | POA: Diagnosis not present

## 2017-06-09 DIAGNOSIS — N182 Chronic kidney disease, stage 2 (mild): Secondary | ICD-10-CM | POA: Diagnosis not present

## 2017-06-09 DIAGNOSIS — E782 Mixed hyperlipidemia: Secondary | ICD-10-CM | POA: Diagnosis not present

## 2017-06-09 DIAGNOSIS — K210 Gastro-esophageal reflux disease with esophagitis: Secondary | ICD-10-CM | POA: Diagnosis not present

## 2017-09-08 DIAGNOSIS — K210 Gastro-esophageal reflux disease with esophagitis: Secondary | ICD-10-CM | POA: Diagnosis not present

## 2017-09-08 DIAGNOSIS — I10 Essential (primary) hypertension: Secondary | ICD-10-CM | POA: Diagnosis not present

## 2017-09-08 DIAGNOSIS — E782 Mixed hyperlipidemia: Secondary | ICD-10-CM | POA: Diagnosis not present

## 2017-09-08 DIAGNOSIS — Z6826 Body mass index (BMI) 26.0-26.9, adult: Secondary | ICD-10-CM | POA: Diagnosis not present

## 2017-09-08 DIAGNOSIS — N182 Chronic kidney disease, stage 2 (mild): Secondary | ICD-10-CM | POA: Diagnosis not present

## 2017-12-13 DIAGNOSIS — N182 Chronic kidney disease, stage 2 (mild): Secondary | ICD-10-CM | POA: Diagnosis not present

## 2017-12-13 DIAGNOSIS — E782 Mixed hyperlipidemia: Secondary | ICD-10-CM | POA: Diagnosis not present

## 2017-12-13 DIAGNOSIS — Z6826 Body mass index (BMI) 26.0-26.9, adult: Secondary | ICD-10-CM | POA: Diagnosis not present

## 2017-12-13 DIAGNOSIS — I10 Essential (primary) hypertension: Secondary | ICD-10-CM | POA: Diagnosis not present

## 2017-12-13 DIAGNOSIS — Z125 Encounter for screening for malignant neoplasm of prostate: Secondary | ICD-10-CM | POA: Diagnosis not present

## 2017-12-13 DIAGNOSIS — K210 Gastro-esophageal reflux disease with esophagitis: Secondary | ICD-10-CM | POA: Diagnosis not present

## 2018-01-21 DIAGNOSIS — Z7722 Contact with and (suspected) exposure to environmental tobacco smoke (acute) (chronic): Secondary | ICD-10-CM | POA: Diagnosis not present

## 2018-01-21 DIAGNOSIS — K5900 Constipation, unspecified: Secondary | ICD-10-CM | POA: Diagnosis not present

## 2018-01-21 DIAGNOSIS — Z8249 Family history of ischemic heart disease and other diseases of the circulatory system: Secondary | ICD-10-CM | POA: Diagnosis not present

## 2018-01-21 DIAGNOSIS — I10 Essential (primary) hypertension: Secondary | ICD-10-CM | POA: Diagnosis not present

## 2018-01-21 DIAGNOSIS — Z87891 Personal history of nicotine dependence: Secondary | ICD-10-CM | POA: Diagnosis not present

## 2018-01-21 DIAGNOSIS — K219 Gastro-esophageal reflux disease without esophagitis: Secondary | ICD-10-CM | POA: Diagnosis not present

## 2018-03-14 DIAGNOSIS — I10 Essential (primary) hypertension: Secondary | ICD-10-CM | POA: Diagnosis not present

## 2018-03-14 DIAGNOSIS — E782 Mixed hyperlipidemia: Secondary | ICD-10-CM | POA: Diagnosis not present

## 2018-03-14 DIAGNOSIS — N182 Chronic kidney disease, stage 2 (mild): Secondary | ICD-10-CM | POA: Diagnosis not present

## 2018-03-14 DIAGNOSIS — Z6827 Body mass index (BMI) 27.0-27.9, adult: Secondary | ICD-10-CM | POA: Diagnosis not present

## 2018-03-14 DIAGNOSIS — K210 Gastro-esophageal reflux disease with esophagitis: Secondary | ICD-10-CM | POA: Diagnosis not present

## 2018-06-16 DIAGNOSIS — I10 Essential (primary) hypertension: Secondary | ICD-10-CM | POA: Diagnosis not present

## 2018-06-16 DIAGNOSIS — N182 Chronic kidney disease, stage 2 (mild): Secondary | ICD-10-CM | POA: Diagnosis not present

## 2018-06-16 DIAGNOSIS — Z0000 Encounter for general adult medical examination without abnormal findings: Secondary | ICD-10-CM | POA: Diagnosis not present

## 2018-06-16 DIAGNOSIS — Z6827 Body mass index (BMI) 27.0-27.9, adult: Secondary | ICD-10-CM | POA: Diagnosis not present

## 2018-06-16 DIAGNOSIS — Z1389 Encounter for screening for other disorder: Secondary | ICD-10-CM | POA: Diagnosis not present

## 2018-06-16 DIAGNOSIS — K210 Gastro-esophageal reflux disease with esophagitis: Secondary | ICD-10-CM | POA: Diagnosis not present

## 2018-06-16 DIAGNOSIS — E782 Mixed hyperlipidemia: Secondary | ICD-10-CM | POA: Diagnosis not present

## 2018-09-14 DIAGNOSIS — K219 Gastro-esophageal reflux disease without esophagitis: Secondary | ICD-10-CM | POA: Diagnosis not present

## 2018-09-14 DIAGNOSIS — Z7982 Long term (current) use of aspirin: Secondary | ICD-10-CM | POA: Diagnosis not present

## 2018-09-14 DIAGNOSIS — I7090 Unspecified atherosclerosis: Secondary | ICD-10-CM | POA: Diagnosis not present

## 2018-09-14 DIAGNOSIS — R69 Illness, unspecified: Secondary | ICD-10-CM | POA: Diagnosis not present

## 2018-09-14 DIAGNOSIS — I10 Essential (primary) hypertension: Secondary | ICD-10-CM | POA: Diagnosis not present

## 2018-09-14 DIAGNOSIS — Z720 Tobacco use: Secondary | ICD-10-CM | POA: Diagnosis not present

## 2018-09-22 DIAGNOSIS — Z1389 Encounter for screening for other disorder: Secondary | ICD-10-CM | POA: Diagnosis not present

## 2018-09-22 DIAGNOSIS — I10 Essential (primary) hypertension: Secondary | ICD-10-CM | POA: Diagnosis not present

## 2018-09-22 DIAGNOSIS — K210 Gastro-esophageal reflux disease with esophagitis: Secondary | ICD-10-CM | POA: Diagnosis not present

## 2018-09-22 DIAGNOSIS — Z0000 Encounter for general adult medical examination without abnormal findings: Secondary | ICD-10-CM | POA: Diagnosis not present

## 2018-09-22 DIAGNOSIS — N182 Chronic kidney disease, stage 2 (mild): Secondary | ICD-10-CM | POA: Diagnosis not present

## 2018-09-22 DIAGNOSIS — Z6827 Body mass index (BMI) 27.0-27.9, adult: Secondary | ICD-10-CM | POA: Diagnosis not present

## 2018-09-22 DIAGNOSIS — E782 Mixed hyperlipidemia: Secondary | ICD-10-CM | POA: Diagnosis not present

## 2019-01-16 DIAGNOSIS — E782 Mixed hyperlipidemia: Secondary | ICD-10-CM | POA: Diagnosis not present

## 2019-01-16 DIAGNOSIS — K210 Gastro-esophageal reflux disease with esophagitis: Secondary | ICD-10-CM | POA: Diagnosis not present

## 2019-01-16 DIAGNOSIS — M545 Low back pain: Secondary | ICD-10-CM | POA: Diagnosis not present

## 2019-01-16 DIAGNOSIS — N182 Chronic kidney disease, stage 2 (mild): Secondary | ICD-10-CM | POA: Diagnosis not present

## 2019-01-16 DIAGNOSIS — I10 Essential (primary) hypertension: Secondary | ICD-10-CM | POA: Diagnosis not present

## 2019-01-16 DIAGNOSIS — Z6828 Body mass index (BMI) 28.0-28.9, adult: Secondary | ICD-10-CM | POA: Diagnosis not present

## 2022-03-30 DIAGNOSIS — E782 Mixed hyperlipidemia: Secondary | ICD-10-CM | POA: Diagnosis not present

## 2022-03-30 DIAGNOSIS — N1831 Chronic kidney disease, stage 3a: Secondary | ICD-10-CM | POA: Diagnosis not present

## 2022-03-30 DIAGNOSIS — Z125 Encounter for screening for malignant neoplasm of prostate: Secondary | ICD-10-CM | POA: Diagnosis not present

## 2022-03-30 DIAGNOSIS — Z131 Encounter for screening for diabetes mellitus: Secondary | ICD-10-CM | POA: Diagnosis not present

## 2022-03-30 DIAGNOSIS — Z6825 Body mass index (BMI) 25.0-25.9, adult: Secondary | ICD-10-CM | POA: Diagnosis not present

## 2022-03-30 DIAGNOSIS — K2100 Gastro-esophageal reflux disease with esophagitis, without bleeding: Secondary | ICD-10-CM | POA: Diagnosis not present

## 2022-03-30 DIAGNOSIS — I10 Essential (primary) hypertension: Secondary | ICD-10-CM | POA: Diagnosis not present

## 2022-08-05 DIAGNOSIS — E782 Mixed hyperlipidemia: Secondary | ICD-10-CM | POA: Diagnosis not present

## 2022-08-05 DIAGNOSIS — Z0000 Encounter for general adult medical examination without abnormal findings: Secondary | ICD-10-CM | POA: Diagnosis not present

## 2022-08-05 DIAGNOSIS — K2100 Gastro-esophageal reflux disease with esophagitis, without bleeding: Secondary | ICD-10-CM | POA: Diagnosis not present

## 2022-08-05 DIAGNOSIS — Z6826 Body mass index (BMI) 26.0-26.9, adult: Secondary | ICD-10-CM | POA: Diagnosis not present

## 2022-08-05 DIAGNOSIS — I10 Essential (primary) hypertension: Secondary | ICD-10-CM | POA: Diagnosis not present

## 2022-08-05 DIAGNOSIS — N1831 Chronic kidney disease, stage 3a: Secondary | ICD-10-CM | POA: Diagnosis not present

## 2022-11-18 DIAGNOSIS — E782 Mixed hyperlipidemia: Secondary | ICD-10-CM | POA: Diagnosis not present

## 2022-11-18 DIAGNOSIS — K2100 Gastro-esophageal reflux disease with esophagitis, without bleeding: Secondary | ICD-10-CM | POA: Diagnosis not present

## 2022-11-18 DIAGNOSIS — Z0000 Encounter for general adult medical examination without abnormal findings: Secondary | ICD-10-CM | POA: Diagnosis not present

## 2022-11-18 DIAGNOSIS — Z6826 Body mass index (BMI) 26.0-26.9, adult: Secondary | ICD-10-CM | POA: Diagnosis not present

## 2022-11-18 DIAGNOSIS — N1831 Chronic kidney disease, stage 3a: Secondary | ICD-10-CM | POA: Diagnosis not present

## 2022-11-18 DIAGNOSIS — I10 Essential (primary) hypertension: Secondary | ICD-10-CM | POA: Diagnosis not present

## 2023-02-17 DIAGNOSIS — I10 Essential (primary) hypertension: Secondary | ICD-10-CM | POA: Diagnosis not present

## 2023-02-17 DIAGNOSIS — Z6826 Body mass index (BMI) 26.0-26.9, adult: Secondary | ICD-10-CM | POA: Diagnosis not present

## 2023-02-17 DIAGNOSIS — K2100 Gastro-esophageal reflux disease with esophagitis, without bleeding: Secondary | ICD-10-CM | POA: Diagnosis not present

## 2023-02-17 DIAGNOSIS — N182 Chronic kidney disease, stage 2 (mild): Secondary | ICD-10-CM | POA: Diagnosis not present

## 2023-02-17 DIAGNOSIS — E782 Mixed hyperlipidemia: Secondary | ICD-10-CM | POA: Diagnosis not present

## 2023-02-22 DIAGNOSIS — Z1211 Encounter for screening for malignant neoplasm of colon: Secondary | ICD-10-CM | POA: Diagnosis not present

## 2023-03-01 ENCOUNTER — Encounter (INDEPENDENT_AMBULATORY_CARE_PROVIDER_SITE_OTHER): Payer: Self-pay | Admitting: *Deleted

## 2023-04-26 DIAGNOSIS — Z0000 Encounter for general adult medical examination without abnormal findings: Secondary | ICD-10-CM | POA: Diagnosis not present

## 2023-04-26 DIAGNOSIS — Z6825 Body mass index (BMI) 25.0-25.9, adult: Secondary | ICD-10-CM | POA: Diagnosis not present

## 2023-04-26 DIAGNOSIS — R630 Anorexia: Secondary | ICD-10-CM | POA: Diagnosis not present

## 2023-04-26 DIAGNOSIS — N182 Chronic kidney disease, stage 2 (mild): Secondary | ICD-10-CM | POA: Diagnosis not present

## 2023-04-26 DIAGNOSIS — I10 Essential (primary) hypertension: Secondary | ICD-10-CM | POA: Diagnosis not present

## 2023-04-26 DIAGNOSIS — E782 Mixed hyperlipidemia: Secondary | ICD-10-CM | POA: Diagnosis not present

## 2023-04-26 DIAGNOSIS — K2100 Gastro-esophageal reflux disease with esophagitis, without bleeding: Secondary | ICD-10-CM | POA: Diagnosis not present

## 2023-04-27 ENCOUNTER — Other Ambulatory Visit (HOSPITAL_COMMUNITY): Payer: Self-pay | Admitting: Internal Medicine

## 2023-04-27 DIAGNOSIS — R109 Unspecified abdominal pain: Secondary | ICD-10-CM

## 2023-07-27 DIAGNOSIS — N182 Chronic kidney disease, stage 2 (mild): Secondary | ICD-10-CM | POA: Diagnosis not present

## 2023-07-27 DIAGNOSIS — E782 Mixed hyperlipidemia: Secondary | ICD-10-CM | POA: Diagnosis not present

## 2023-07-27 DIAGNOSIS — I10 Essential (primary) hypertension: Secondary | ICD-10-CM | POA: Diagnosis not present

## 2023-07-27 DIAGNOSIS — R630 Anorexia: Secondary | ICD-10-CM | POA: Diagnosis not present

## 2023-07-27 DIAGNOSIS — H00026 Hordeolum internum left eye, unspecified eyelid: Secondary | ICD-10-CM | POA: Diagnosis not present

## 2023-07-27 DIAGNOSIS — K2100 Gastro-esophageal reflux disease with esophagitis, without bleeding: Secondary | ICD-10-CM | POA: Diagnosis not present

## 2023-07-27 DIAGNOSIS — Z6824 Body mass index (BMI) 24.0-24.9, adult: Secondary | ICD-10-CM | POA: Diagnosis not present

## 2023-09-02 ENCOUNTER — Encounter (INDEPENDENT_AMBULATORY_CARE_PROVIDER_SITE_OTHER): Payer: Self-pay | Admitting: *Deleted

## 2023-10-26 DIAGNOSIS — Z0000 Encounter for general adult medical examination without abnormal findings: Secondary | ICD-10-CM | POA: Diagnosis not present

## 2023-10-26 DIAGNOSIS — R630 Anorexia: Secondary | ICD-10-CM | POA: Diagnosis not present

## 2023-10-26 DIAGNOSIS — N182 Chronic kidney disease, stage 2 (mild): Secondary | ICD-10-CM | POA: Diagnosis not present

## 2023-10-26 DIAGNOSIS — I10 Essential (primary) hypertension: Secondary | ICD-10-CM | POA: Diagnosis not present

## 2023-10-26 DIAGNOSIS — E782 Mixed hyperlipidemia: Secondary | ICD-10-CM | POA: Diagnosis not present

## 2023-10-26 DIAGNOSIS — H00026 Hordeolum internum left eye, unspecified eyelid: Secondary | ICD-10-CM | POA: Diagnosis not present

## 2023-10-26 DIAGNOSIS — K2100 Gastro-esophageal reflux disease with esophagitis, without bleeding: Secondary | ICD-10-CM | POA: Diagnosis not present

## 2023-10-26 DIAGNOSIS — Z6824 Body mass index (BMI) 24.0-24.9, adult: Secondary | ICD-10-CM | POA: Diagnosis not present

## 2023-11-23 DIAGNOSIS — R630 Anorexia: Secondary | ICD-10-CM | POA: Diagnosis not present

## 2023-11-23 DIAGNOSIS — N281 Cyst of kidney, acquired: Secondary | ICD-10-CM | POA: Diagnosis not present

## 2023-11-23 DIAGNOSIS — Z9049 Acquired absence of other specified parts of digestive tract: Secondary | ICD-10-CM | POA: Diagnosis not present

## 2024-01-27 DIAGNOSIS — K2100 Gastro-esophageal reflux disease with esophagitis, without bleeding: Secondary | ICD-10-CM | POA: Diagnosis not present

## 2024-01-27 DIAGNOSIS — N182 Chronic kidney disease, stage 2 (mild): Secondary | ICD-10-CM | POA: Diagnosis not present

## 2024-01-27 DIAGNOSIS — E782 Mixed hyperlipidemia: Secondary | ICD-10-CM | POA: Diagnosis not present

## 2024-01-27 DIAGNOSIS — I10 Essential (primary) hypertension: Secondary | ICD-10-CM | POA: Diagnosis not present

## 2024-01-27 DIAGNOSIS — Z6824 Body mass index (BMI) 24.0-24.9, adult: Secondary | ICD-10-CM | POA: Diagnosis not present

## 2024-05-23 DIAGNOSIS — I10 Essential (primary) hypertension: Secondary | ICD-10-CM | POA: Diagnosis not present

## 2024-05-23 DIAGNOSIS — E782 Mixed hyperlipidemia: Secondary | ICD-10-CM | POA: Diagnosis not present

## 2024-06-06 DIAGNOSIS — N182 Chronic kidney disease, stage 2 (mild): Secondary | ICD-10-CM | POA: Diagnosis not present

## 2024-06-06 DIAGNOSIS — Z6823 Body mass index (BMI) 23.0-23.9, adult: Secondary | ICD-10-CM | POA: Diagnosis not present

## 2024-06-06 DIAGNOSIS — Z720 Tobacco use: Secondary | ICD-10-CM | POA: Diagnosis not present

## 2024-06-06 DIAGNOSIS — I10 Essential (primary) hypertension: Secondary | ICD-10-CM | POA: Diagnosis not present

## 2024-06-06 DIAGNOSIS — Z7982 Long term (current) use of aspirin: Secondary | ICD-10-CM | POA: Diagnosis not present

## 2024-06-06 DIAGNOSIS — E782 Mixed hyperlipidemia: Secondary | ICD-10-CM | POA: Diagnosis not present

## 2024-06-06 DIAGNOSIS — Z0000 Encounter for general adult medical examination without abnormal findings: Secondary | ICD-10-CM | POA: Diagnosis not present

## 2024-06-06 DIAGNOSIS — J449 Chronic obstructive pulmonary disease, unspecified: Secondary | ICD-10-CM | POA: Diagnosis not present

## 2024-06-06 DIAGNOSIS — K2100 Gastro-esophageal reflux disease with esophagitis, without bleeding: Secondary | ICD-10-CM | POA: Diagnosis not present

## 2024-06-06 DIAGNOSIS — Z125 Encounter for screening for malignant neoplasm of prostate: Secondary | ICD-10-CM | POA: Diagnosis not present

## 2024-06-06 DIAGNOSIS — R630 Anorexia: Secondary | ICD-10-CM | POA: Diagnosis not present

## 2024-06-06 DIAGNOSIS — E785 Hyperlipidemia, unspecified: Secondary | ICD-10-CM | POA: Diagnosis not present

## 2024-06-08 ENCOUNTER — Encounter (INDEPENDENT_AMBULATORY_CARE_PROVIDER_SITE_OTHER): Payer: Self-pay | Admitting: *Deleted

## 2024-06-21 DIAGNOSIS — I10 Essential (primary) hypertension: Secondary | ICD-10-CM | POA: Diagnosis not present

## 2024-06-21 DIAGNOSIS — E782 Mixed hyperlipidemia: Secondary | ICD-10-CM | POA: Diagnosis not present

## 2024-06-28 ENCOUNTER — Telehealth: Payer: Self-pay

## 2024-06-28 NOTE — Telephone Encounter (Signed)
 Who is your primary care physician: Dr.Hasanji  Reasons for the colonoscopy: screening   Have you had a colonoscopy before?  no  Do you have family history of colon cancer? no  Previous colonoscopy with polyps removed? no  Do you have a history colorectal cancer?   no  Are you diabetic? If yes, Type 1 or Type 2?    no  Do you have a prosthetic or mechanical heart valve? no  Do you have a pacemaker/defibrillator?   no  Have you had endocarditis/atrial fibrillation? no  Have you had joint replacement within the last 12 months?  no  Do you tend to be constipated or have to use laxatives? no  Do you have any history of drugs or alchohol?  no  Do you use supplemental oxygen?  no  Have you had a stroke or heart attack within the last 6 months? no  Do you take weight loss medication?  no    Do you take any blood-thinning medications such as: (aspirin, warfarin, Plavix, Aggrenox)  no  If yes we need the name, milligram, dosage and who is prescribing doctor  Current Outpatient Medications on File Prior to Visit  Medication Sig Dispense Refill   atorvastatin (LIPITOR) 40 MG tablet 40 mg.     Multiple Vitamin (MULTIVITAMIN) tablet Take 1 tablet by mouth daily.     olmesartan (BENICAR) 20 MG tablet Take 20 mg by mouth daily.     sildenafil (REVATIO) 20 MG tablet Take 20 mg by mouth daily as needed.     No current facility-administered medications on file prior to visit.    No Known Allergies   Pharmacy: [facility]  Primary Insurance Name: Medicare a/b [id number] A   Best number where you can be reached: [phone]

## 2024-06-28 NOTE — Telephone Encounter (Signed)
Ok to schedule.  Room :Any   Thanks,  Vista Lawman, MD Gastroenterology and Hepatology [facility]

## 2024-06-30 NOTE — Telephone Encounter (Signed)
Called pt, no answer and no VM. Line rang numerous times

## 2024-07-06 NOTE — Telephone Encounter (Signed)
 Called pt, no answer and not able to leave VM. Mailed letter.

## 2024-07-17 ENCOUNTER — Encounter (INDEPENDENT_AMBULATORY_CARE_PROVIDER_SITE_OTHER): Payer: Self-pay | Admitting: *Deleted

## 2024-07-17 MED ORDER — PEG 3350-KCL-NA BICARB-NACL 420 G PO SOLR: 4000.0000 mL | Freq: Once | ORAL | 0 refills | Status: AC

## 2024-07-17 NOTE — Telephone Encounter (Signed)
 Pt called in. He has been scheduled for 12/4 with Dr. Cinderella. Aware will [facility] and will mail his instructions to him. He voiced understanding. Also aware he will get a pre-op phone call with his arrival time for procedure.

## 2024-07-17 NOTE — Addendum Note (Signed)
 Addended by: JEANELL GRAEME RAMAN on: 07/17/2024 09:43 AM   Modules accepted: Orders

## 2024-07-17 NOTE — Telephone Encounter (Signed)
 Referral completed, [facility] apt letter sent to PCP

## 2024-07-19 DIAGNOSIS — E782 Mixed hyperlipidemia: Secondary | ICD-10-CM | POA: Diagnosis not present

## 2024-07-19 DIAGNOSIS — I10 Essential (primary) hypertension: Secondary | ICD-10-CM | POA: Diagnosis not present

## 2024-08-01 ENCOUNTER — Encounter (HOSPITAL_COMMUNITY)
Admission: RE | Admit: 2024-08-01 | Discharge: 2024-08-01 | Disposition: A | Payer: Self-pay | Source: Ambulatory Visit | Attending: Gastroenterology

## 2024-08-01 ENCOUNTER — Other Ambulatory Visit: Payer: Self-pay

## 2024-08-01 ENCOUNTER — Encounter (HOSPITAL_COMMUNITY): Payer: Self-pay

## 2024-08-03 ENCOUNTER — Other Ambulatory Visit: Payer: Self-pay

## 2024-08-03 ENCOUNTER — Encounter (HOSPITAL_COMMUNITY): Admission: RE | Disposition: A | Payer: Self-pay | Source: Home / Self Care | Attending: Gastroenterology

## 2024-08-03 ENCOUNTER — Ambulatory Visit (HOSPITAL_COMMUNITY)

## 2024-08-03 ENCOUNTER — Ambulatory Visit (HOSPITAL_COMMUNITY)
Admission: RE | Admit: 2024-08-03 | Discharge: 2024-08-03 | Disposition: A | Attending: Gastroenterology | Admitting: Gastroenterology

## 2024-08-03 ENCOUNTER — Encounter (HOSPITAL_COMMUNITY): Payer: Self-pay | Admitting: Gastroenterology

## 2024-08-03 DIAGNOSIS — D123 Benign neoplasm of transverse colon: Secondary | ICD-10-CM

## 2024-08-03 DIAGNOSIS — K648 Other hemorrhoids: Secondary | ICD-10-CM

## 2024-08-03 DIAGNOSIS — D124 Benign neoplasm of descending colon: Secondary | ICD-10-CM | POA: Diagnosis not present

## 2024-08-03 DIAGNOSIS — Z1211 Encounter for screening for malignant neoplasm of colon: Secondary | ICD-10-CM

## 2024-08-03 DIAGNOSIS — D125 Benign neoplasm of sigmoid colon: Secondary | ICD-10-CM | POA: Diagnosis not present

## 2024-08-03 HISTORY — PX: COLONOSCOPY: SHX5424

## 2024-08-03 HISTORY — PX: POLYPECTOMY: SHX149

## 2024-08-03 LAB — HM COLONOSCOPY

## 2024-08-03 SURGERY — COLONOSCOPY
Anesthesia: General

## 2024-08-03 MED ORDER — LINACLOTIDE 145 MCG PO CAPS: 145.0000 ug | ORAL_CAPSULE | Freq: Every day | ORAL | 1 refills | Status: DC

## 2024-08-03 MED ORDER — PROPOFOL 500 MG/50ML IV EMUL
INTRAVENOUS | Status: DC | PRN
  Administered 2024-08-03: 200 ug/kg/min via INTRAVENOUS

## 2024-08-03 MED ORDER — PROPOFOL 10 MG/ML IV BOLUS
INTRAVENOUS | Status: DC | PRN
  Administered 2024-08-03: 50 mg via INTRAVENOUS

## 2024-08-03 MED ORDER — LACTATED RINGERS IV SOLN: INTRAVENOUS | Status: DC

## 2024-08-03 NOTE — Op Note (Signed)
 [facility] Patient Name: Nathan Gonzalez Procedure Date: 08/03/2024 12:22 PM MRN: [id number] Date of Birth: [birth year] Attending MD: Deatrice Dine , MD, [id number] CSN: [id number] Age: 73 Admit Type: Outpatient Procedure:                Colonoscopy Indications:              Screening for colorectal malignant neoplasm Providers:                Deatrice Dine, MD, Harlene Lips, Dorcas Lenis, Technician Referring MD:              Medicines:                Monitored Anesthesia Care Complications:            No immediate complications. Estimated Blood Loss:     Estimated blood loss was minimal. Procedure:                Pre-Anesthesia Assessment:                           - Prior to the procedure, a History and Physical                            was performed, and patient medications and                            allergies were reviewed. The patient's tolerance of                            previous anesthesia was also reviewed. The risks                            and benefits of the procedure and the sedation                            options and risks were discussed with the patient.                            All questions were answered, and informed consent                            was obtained. Prior Anticoagulants: The patient has                            taken no anticoagulant or antiplatelet agents                            except for aspirin. ASA Grade Assessment: II - A                            patient with mild systemic disease. After reviewing  the risks and benefits, the patient was deemed in                            satisfactory condition to undergo the procedure.                           After obtaining informed consent, the colonoscope                            was passed under direct vision. Throughout the                            procedure, the patient's blood pressure, pulse, and                             oxygen saturations were monitored continuously. The                            [id number] ([id number]) Colon was introduced through                            the anus and advanced to the the cecum, identified                            by appendiceal orifice and ileocecal valve. The                            colonoscopy was performed without difficulty. The                            patient tolerated the procedure well. The quality                            of the bowel preparation was fair. The ileocecal                            valve, appendiceal orifice, and rectum were                            photographed. Scope In: 1:26:54 PM Scope Out: 1:53:05 PM Scope Withdrawal Time: 0 hours 21 minutes 53 seconds  Total Procedure Duration: 0 hours 26 minutes 11 seconds  Findings:      The perianal and digital rectal examinations were normal.      A 10 mm polyp was found in the sigmoid colon. The polyp was       pedunculated. The polyp was removed with a cold snare. Resection and       retrieval were complete.      A 15 mm polyp was found in the distal sigmoid colon. The polyp was       semi-pedunculated. The polyp was removed with a cold snare. Resection       and retrieval were complete.      Four sessile polyps were found in the descending colon and transverse       colon. The polyps were 3  to 7 mm in size. These polyps were removed with       a cold snare. Resection and retrieval were complete.      A moderate amount of stool was found in the entire colon, precluding       visualization. Lavage of the area was performed using a large amount of       sterile water , resulting in clearance with fair visualization.      Non-bleeding internal hemorrhoids were found during retroflexion. The       hemorrhoids were small. Impression:               - Preparation of the colon was fair.                           - One 10 mm polyp in the sigmoid colon, removed                             with a cold snare. Resected and retrieved.                           - One 15 mm polyp in the distal sigmoid colon,                            removed with a cold snare. Resected and retrieved.                           - Four 3 to 7 mm polyps in the descending colon and                            in the transverse colon, removed with a cold snare.                            Resected and retrieved.                           - Stool in the entire examined colon.                           - Non-bleeding internal hemorrhoids. Moderate Sedation:      Per Anesthesia Care Recommendation:           - Patient has a contact number available for                            emergencies. The signs and symptoms of potential                            delayed complications were discussed with the                            patient. Return to normal activities tomorrow.                            Written discharge instructions were provided to the  patient.                           - Resume previous diet.                           - Continue present medications.                           - Await pathology results.                           - Repeat colonoscopy in 6 months because the bowel                            preparation was fair and for surveillance of                            multiple polyps. EXTENDED BOWEL PREP Procedure Code(s):        --- Professional ---                           [phone], Colonoscopy, flexible; with removal of                            tumor(s), polyp(s), or other lesion(s) by snare                            technique Diagnosis Code(s):        --- Professional ---                           Z12.11, Encounter for screening for malignant                            neoplasm of colon                           K64.8, Other hemorrhoids                           D12.5, Benign neoplasm of sigmoid colon                           D12.4, Benign neoplasm  of descending colon                           D12.3, Benign neoplasm of transverse colon (hepatic                            flexure or splenic flexure) CPT copyright 2022 American Medical Association. All rights reserved. The codes documented in this report are preliminary and upon coder review may  be revised to meet current compliance requirements. Deatrice Dine, MD Deatrice Dine, MD 08/03/2024 2:00:07 PM This report has been signed electronically. Number of Addenda: 0

## 2024-08-03 NOTE — Anesthesia Postprocedure Evaluation (Signed)
 Anesthesia Post Note  Patient: Nathan Gonzalez  Procedure(s) Performed: COLONOSCOPY POLYPECTOMY, INTESTINE  Patient location during evaluation: PACU Anesthesia Type: General Level of consciousness: awake and alert Pain management: pain level controlled Vital Signs Assessment: post-procedure vital signs reviewed and stable Respiratory status: spontaneous breathing, nonlabored ventilation, respiratory function stable and patient connected to nasal cannula oxygen Cardiovascular status: blood pressure returned to baseline and stable Postop Assessment: no apparent nausea or vomiting Anesthetic complications: no   No notable events documented.   Last Vitals:  Vitals:   08/03/24 1051 08/03/24 1359  BP: (!) 154/82 (!) 109/54  Pulse: 68 63  Resp: 12 19  Temp:  36.7 C  SpO2:  100%    Last Pain:  Vitals:   08/03/24 1359  TempSrc: Oral  PainSc: 0-No pain                 Andrea Limes

## 2024-08-03 NOTE — Transfer of Care (Signed)
 Immediate Anesthesia Transfer of Care Note  Patient: Nathan Gonzalez  Procedure(s) Performed: COLONOSCOPY POLYPECTOMY, INTESTINE  Patient Location: Short Stay  Anesthesia Type:General  Level of Consciousness: awake, alert , oriented, and patient cooperative  Airway & Oxygen Therapy: Patient Spontanous Breathing  Post-op Assessment: Report given to RN, Post -op Vital signs reviewed and stable, and Patient moving all extremities X 4  Post vital signs: Reviewed and stable  Last Vitals:  Vitals Value Taken Time  BP 109/54 08/03/24 13:59  Temp 36.7 C 08/03/24 13:59  Pulse 63 08/03/24 13:59  Resp 19 08/03/24 13:59  SpO2 100 % 08/03/24 13:59    Last Pain:  Vitals:   08/03/24 1359  TempSrc: Oral  PainSc: 0-No pain      Patients Stated Pain Goal: 5 (08/03/24 1051)  Complications: No notable events documented.

## 2024-08-03 NOTE — Anesthesia Preprocedure Evaluation (Addendum)
 Anesthesia Evaluation  Patient identified by MRN, date of birth, ID band Patient awake    Reviewed: Allergy & Precautions, H&P , NPO status , Patient's Chart, lab work & pertinent test results  Airway Mallampati: II  TM Distance: >3 FB Neck ROM: Full    Dental  (+) Partial Upper   Pulmonary Current Smoker and Patient abstained from smoking.   Pulmonary exam normal breath sounds clear to auscultation       Cardiovascular hypertension, Normal cardiovascular exam Rhythm:Regular Rate:Normal     Neuro/Psych  Headaches  negative psych ROS   GI/Hepatic negative GI ROS, Neg liver ROS,,,  Endo/Other  negative endocrine ROS    Renal/GU negative Renal ROS  negative genitourinary   Musculoskeletal negative musculoskeletal ROS (+)    Abdominal   Peds negative pediatric ROS (+)  Hematology negative hematology ROS (+)   Anesthesia Other Findings   Reproductive/Obstetrics negative OB ROS                              Anesthesia Physical Anesthesia Plan  ASA: 2  Anesthesia Plan: General   Post-op Pain Management:    Induction: Intravenous  PONV Risk Score and Plan:   Airway Management Planned: Nasal Cannula  Additional Equipment:   Intra-op Plan:   Post-operative Plan:   Informed Consent: I have reviewed the patients History and Physical, chart, labs and discussed the procedure including the risks, benefits and alternatives for the proposed anesthesia with the patient or authorized representative who has indicated his/her understanding and acceptance.     Dental advisory given  Plan Discussed with: CRNA  Anesthesia Plan Comments:          Anesthesia Quick Evaluation

## 2024-08-03 NOTE — Discharge Instructions (Signed)
  Discharge instructions Please read the instructions outlined below and refer to this sheet in the next few weeks. These discharge instructions provide you with general information on caring for yourself after you leave the hospital. Your doctor may also give you specific instructions. While your treatment has been planned according to the most current medical practices available, unavoidable complications occasionally occur. If you have any problems or questions after discharge, please call your doctor. ACTIVITY You may resume your regular activity but move at a slower pace for the next 24 hours.  Take frequent rest periods for the next 24 hours.  Walking will help expel (get rid of) the air and reduce the bloated feeling in your abdomen.  No driving for 24 hours (because of the anesthesia (medicine) used during the test).  You may shower.  Do not sign any important legal documents or operate any machinery for 24 hours (because of the anesthesia used during the test).  NUTRITION Drink plenty of fluids.  You may resume your normal diet.  Begin with a light meal and progress to your normal diet.  Avoid alcoholic beverages for 24 hours or as instructed by your caregiver.  MEDICATIONS You may resume your normal medications unless your caregiver tells you otherwise.  WHAT YOU CAN EXPECT TODAY You may experience abdominal discomfort such as a feeling of fullness or "gas" pains.  FOLLOW-UP Your doctor will discuss the results of your test with you.  SEEK IMMEDIATE MEDICAL ATTENTION IF ANY OF THE FOLLOWING OCCUR: Excessive nausea (feeling sick to your stomach) and/or vomiting.  Severe abdominal pain and distention (swelling).  Trouble swallowing.  Temperature over 101 F (37.8 C).  Rectal bleeding or vomiting of blood.    Start linzess 145mcg    I hope you have a great rest of your week!   Zaeda Mcferran Faizan Salvator Seppala , M.D.. Gastroenterology and Hepatology [facility]

## 2024-08-03 NOTE — H&P (Signed)
 Primary Care Physician:  Orpha Yancey LABOR, MD Primary Gastroenterologist:  Dr. Cinderella  Pre-Procedure History & Physical: HPI:  Nathan Gonzalez is a 73 y.o. male is here for a colonoscopy for colon cancer screening purposes.  Patient denies any family history of colorectal cancer.  No melena or hematochezia.  No abdominal pain or unintentional weight loss.  No change in bowel habits.  Overall feels well from a GI standpoint.  Past Medical History:  Diagnosis Date   Headache    Hypertension    was on clonnidine but now not taking    Past Surgical History:  Procedure Laterality Date   BALLOON DILATION N/A 03/13/2016   Procedure: BALLOON DILATION;  Surgeon: Claudis RAYMOND Rivet, MD;  Location: [facility];  Service: Endoscopy;  Laterality: N/A;   CHOLECYSTECTOMY     CHOLECYSTECTOMY, LAPAROSCOPIC Bilateral 01/01/2016   performed at [facility]   collarbone Left    Repair collarbone fracture   ERCP N/A 01/03/2016   Procedure: ENDOSCOPIC RETROGRADE CHOLANGIOPANCREATOGRAPHY (ERCP) WITH STENT PLACEMENT;  Surgeon: Claudis RAYMOND Rivet, MD;  Location: [facility];  Service: Endoscopy;  Laterality: N/A;   ERCP N/A 03/13/2016   Procedure: ENDOSCOPIC RETROGRADE CHOLANGIOPANCREATOGRAPHY (ERCP);  Surgeon: Claudis RAYMOND Rivet, MD;  Location: [facility];  Service: Endoscopy;  Laterality: N/A;   GASTROINTESTINAL STENT REMOVAL N/A 03/13/2016   Procedure: BILIARY STENT REMOVAL;  Surgeon: Claudis RAYMOND Rivet, MD;  Location: [facility];  Service: Endoscopy;  Laterality: N/A;   SPHINCTEROTOMY N/A 03/13/2016   Procedure: SPHINCTEROTOMY;  Surgeon: Claudis RAYMOND Rivet, MD;  Location: [facility];  Service: Endoscopy;  Laterality: N/A;    Prior to Admission medications   Medication Sig Start Date End Date Taking? Authorizing Provider  atorvastatin (LIPITOR) 40 MG tablet 40 mg. 05/03/24  Yes [provider]  lisinopril (ZESTRIL) 20 MG tablet Take 20 mg by mouth daily.   Yes [provider]  Multiple Vitamin  (MULTIVITAMIN) tablet Take 1 tablet by mouth daily.   Yes [provider]  olmesartan (BENICAR) 20 MG tablet Take 20 mg by mouth daily. 06/06/24  Yes [provider]  sildenafil (REVATIO) 20 MG tablet Take 20 mg by mouth daily as needed. 02/05/24   [provider]    Allergies as of 07/17/2024   (No Known Allergies)    History reviewed. No pertinent family history.  Social History   Socioeconomic History   Marital status: Married    Spouse name: Not on file   Number of children: Not on file   Years of education: Not on file   Highest education level: Not on file  Occupational History   Not on file  Tobacco Use   Smoking status: Some Days    Current packs/day: 0.00    Types: Cigarettes    Last attempt to quit: 11/30/2015    Years since quitting: 8.6   Smokeless tobacco: Never  Vaping Use   Vaping status: Never Used  Substance and Sexual Activity   Alcohol use: Yes    Comment: beer ,  ocassionally   Drug use: No   Sexual activity: Not on file  Other Topics Concern   Not on file  Social History Narrative   Not on file   Social Drivers of Health   Financial Resource Strain: Not on file  Food Insecurity: Not on file  Transportation Needs: Not on file  Physical Activity: Not on file  Stress: Not on file  Social Connections: Not on file  Intimate Partner Violence: Not on file    Review of Systems: See HPI, otherwise negative ROS  Physical Exam: Vital signs in last 24 hours: Pulse Rate:  [68] 68 (12/04 1051) Resp:  [12] 12 (12/04 1051) BP: (154)/(82) 154/82 (12/04 1051)   General:   Alert,  Well-developed, well-nourished, pleasant and cooperative in NAD Head:  Normocephalic and atraumatic. Eyes:  Sclera clear, no icterus.   Conjunctiva pink. Ears:  Normal auditory acuity. Nose:  No deformity, discharge,  or lesions. Msk:  Symmetrical without gross deformities. Normal posture. Extremities:  Without clubbing or edema. Neurologic:  Alert  and  oriented x4;  grossly normal neurologically. Skin:  Intact without significant lesions or rashes. Psych:  Alert and cooperative. Normal mood and affect.  Impression/Plan: Nathan Gonzalez is here for a colonoscopy to be performed for colon cancer screening purposes.  The risks of the procedure including infection, bleed, or perforation as well as benefits, limitations, alternatives and imponderables have been reviewed with the patient. Questions have been answered. All parties agreeable.

## 2024-08-04 ENCOUNTER — Encounter (HOSPITAL_COMMUNITY): Payer: Self-pay | Admitting: Gastroenterology

## 2024-08-04 LAB — SURGICAL PATHOLOGY

## 2024-08-07 ENCOUNTER — Telehealth (INDEPENDENT_AMBULATORY_CARE_PROVIDER_SITE_OTHER): Payer: Self-pay | Admitting: Gastroenterology

## 2024-08-07 NOTE — Telephone Encounter (Signed)
 Pt left voicemail stating that he had a colon test and the doctor sent in some high expensive medication the he can not afford Attempted to call pt back to get more information but no answer Pt had TCS on 08/03/24 and Linzess  was sent in.

## 2024-08-08 ENCOUNTER — Encounter (INDEPENDENT_AMBULATORY_CARE_PROVIDER_SITE_OTHER): Payer: Self-pay | Admitting: *Deleted

## 2024-08-08 ENCOUNTER — Ambulatory Visit (INDEPENDENT_AMBULATORY_CARE_PROVIDER_SITE_OTHER): Payer: Self-pay | Admitting: Gastroenterology

## 2024-08-10 NOTE — Progress Notes (Signed)
 6 mth TCS noted in recall Patient result letter mailed procedure note and pathology result faxed to PCP

## 2024-08-14 MED ORDER — LUBIPROSTONE 24 MCG PO CAPS: 24.0000 ug | ORAL_CAPSULE | Freq: Two times a day (BID) | ORAL | 3 refills | Status: AC

## 2024-08-14 NOTE — Telephone Encounter (Signed)
 I have prescribed lubiprostone  as an alternative

## 2024-08-14 NOTE — Addendum Note (Signed)
 Addended by: CINDERELLA DEATRICE SMILES on: 08/14/2024 03:06 PM   Modules accepted: Orders

## 2024-08-14 NOTE — Telephone Encounter (Signed)
 Pt contacted. Pt wife stated he has 5 boxes of Linzess . Pt stated he had taken 2 boxes; has 3 left but would like to something cheaper sent in.   Pt wife went through all other medications and was saying something about having surgery and other doctor appointment. I advised wife that we would only be dealing with Linzess . They told me to call PCP that he would have all the information.   [facility]

## 2024-08-16 NOTE — Telephone Encounter (Signed)
 Pt left voicemail in regards to needing to repeat colonoscopy in 6 months. Returned call to pt. Advised pt we would be sending out a questionnaire in 6 months and he would need to fill that out and send it back it in. Pt asked did he have to drink that prep. I advised him he did not have to drink any prep at this time but he would when we get him scheduled for his colonoscopy in 6 months. Pt verbalized understanding.  Informed pt that Amitiza  was sent to pharmacy.

## 2024-09-14 ENCOUNTER — Other Ambulatory Visit (HOSPITAL_COMMUNITY): Payer: Self-pay | Admitting: Internal Medicine

## 2024-09-14 DIAGNOSIS — Z87891 Personal history of nicotine dependence: Secondary | ICD-10-CM

## 2024-10-01 ENCOUNTER — Encounter (HOSPITAL_COMMUNITY): Payer: Self-pay

## 2024-10-01 ENCOUNTER — Ambulatory Visit (HOSPITAL_COMMUNITY): Admission: RE | Admit: 2024-10-01 | Source: Ambulatory Visit

## 2024-10-16 ENCOUNTER — Ambulatory Visit
